# Patient Record
Sex: Female | Born: 1980 | Race: White | Hispanic: No | Marital: Married | Smoking: Never smoker
Health system: Southern US, Community
[De-identification: ages and names within clinical notes are randomized; demographics above are authoritative.]

## PROBLEM LIST (undated history)

## (undated) DIAGNOSIS — T7840XA Allergy, unspecified, initial encounter: Secondary | ICD-10-CM

## (undated) DIAGNOSIS — J309 Allergic rhinitis, unspecified: Secondary | ICD-10-CM

## (undated) DIAGNOSIS — N390 Urinary tract infection, site not specified: Secondary | ICD-10-CM

## (undated) DIAGNOSIS — Z803 Family history of malignant neoplasm of breast: Secondary | ICD-10-CM

## (undated) DIAGNOSIS — Z9189 Other specified personal risk factors, not elsewhere classified: Secondary | ICD-10-CM

## (undated) DIAGNOSIS — Z9289 Personal history of other medical treatment: Secondary | ICD-10-CM

## (undated) DIAGNOSIS — F419 Anxiety disorder, unspecified: Secondary | ICD-10-CM

## (undated) DIAGNOSIS — A749 Chlamydial infection, unspecified: Principal | ICD-10-CM

## (undated) DIAGNOSIS — K219 Gastro-esophageal reflux disease without esophagitis: Secondary | ICD-10-CM

## (undated) HISTORY — DX: Anxiety disorder, unspecified: F41.9

## (undated) HISTORY — DX: Chlamydial infection, unspecified: A74.9

## (undated) HISTORY — DX: Allergic rhinitis, unspecified: J30.9

## (undated) HISTORY — DX: Allergy, unspecified, initial encounter: T78.40XA

## (undated) HISTORY — DX: Urinary tract infection, site not specified: N39.0

## (undated) HISTORY — DX: Gastro-esophageal reflux disease without esophagitis: K21.9

## (undated) HISTORY — DX: Personal history of other medical treatment: Z92.89

## (undated) HISTORY — DX: Family history of malignant neoplasm of breast: Z80.3

## (undated) HISTORY — PX: WISDOM TOOTH EXTRACTION: SHX21

## (undated) HISTORY — DX: Other specified personal risk factors, not elsewhere classified: Z91.89

---

## 2011-07-02 ENCOUNTER — Other Ambulatory Visit: Payer: Self-pay | Admitting: Allergy

## 2011-07-02 ENCOUNTER — Ambulatory Visit
Admission: RE | Admit: 2011-07-02 | Discharge: 2011-07-02 | Disposition: A | Discharge status: Home / Self Care | Payer: Managed Care, Other (non HMO) | Source: Ambulatory Visit | Attending: Allergy | Admitting: Allergy

## 2011-07-02 DIAGNOSIS — J329 Chronic sinusitis, unspecified: Secondary | ICD-10-CM

## 2013-04-07 DIAGNOSIS — Z9189 Other specified personal risk factors, not elsewhere classified: Secondary | ICD-10-CM

## 2013-04-07 HISTORY — DX: Other specified personal risk factors, not elsewhere classified: Z91.89

## 2013-04-27 DIAGNOSIS — Z9289 Personal history of other medical treatment: Secondary | ICD-10-CM

## 2013-04-27 HISTORY — DX: Personal history of other medical treatment: Z92.89

## 2013-06-08 DIAGNOSIS — Z803 Family history of malignant neoplasm of breast: Secondary | ICD-10-CM | POA: Insufficient documentation

## 2013-06-08 HISTORY — DX: Family history of malignant neoplasm of breast: Z80.3

## 2014-05-09 DIAGNOSIS — Z9289 Personal history of other medical treatment: Secondary | ICD-10-CM

## 2014-05-09 HISTORY — DX: Personal history of other medical treatment: Z92.89

## 2015-06-26 LAB — HM PAP SMEAR

## 2015-06-26 LAB — HM MAMMOGRAPHY

## 2016-06-05 DIAGNOSIS — A749 Chlamydial infection, unspecified: Secondary | ICD-10-CM

## 2016-06-05 HISTORY — DX: Chlamydial infection, unspecified: A74.9

## 2016-06-19 ENCOUNTER — Other Ambulatory Visit: Payer: Self-pay | Admitting: Obstetrics and Gynecology

## 2016-06-19 DIAGNOSIS — Z1231 Encounter for screening mammogram for malignant neoplasm of breast: Secondary | ICD-10-CM

## 2016-06-26 ENCOUNTER — Ambulatory Visit (INDEPENDENT_AMBULATORY_CARE_PROVIDER_SITE_OTHER): Payer: BLUE CROSS/BLUE SHIELD | Admitting: Obstetrics and Gynecology

## 2016-06-26 ENCOUNTER — Encounter: Payer: Self-pay | Admitting: Obstetrics and Gynecology

## 2016-06-26 VITALS — BP 138/74 | Ht 67.5 in | Wt 186.0 lb

## 2016-06-26 DIAGNOSIS — Z1231 Encounter for screening mammogram for malignant neoplasm of breast: Secondary | ICD-10-CM

## 2016-06-26 DIAGNOSIS — Z1329 Encounter for screening for other suspected endocrine disorder: Secondary | ICD-10-CM

## 2016-06-26 DIAGNOSIS — Z3041 Encounter for surveillance of contraceptive pills: Secondary | ICD-10-CM

## 2016-06-26 DIAGNOSIS — Z01419 Encounter for gynecological examination (general) (routine) without abnormal findings: Secondary | ICD-10-CM | POA: Diagnosis not present

## 2016-06-26 DIAGNOSIS — Z113 Encounter for screening for infections with a predominantly sexual mode of transmission: Secondary | ICD-10-CM

## 2016-06-26 DIAGNOSIS — Z131 Encounter for screening for diabetes mellitus: Secondary | ICD-10-CM | POA: Diagnosis not present

## 2016-06-26 DIAGNOSIS — J309 Allergic rhinitis, unspecified: Secondary | ICD-10-CM | POA: Insufficient documentation

## 2016-06-26 DIAGNOSIS — R635 Abnormal weight gain: Secondary | ICD-10-CM | POA: Diagnosis not present

## 2016-06-26 DIAGNOSIS — Z9189 Other specified personal risk factors, not elsewhere classified: Secondary | ICD-10-CM | POA: Diagnosis not present

## 2016-06-26 DIAGNOSIS — R102 Pelvic and perineal pain: Secondary | ICD-10-CM | POA: Diagnosis not present

## 2016-06-26 DIAGNOSIS — Z0000 Encounter for general adult medical examination without abnormal findings: Secondary | ICD-10-CM

## 2016-06-26 DIAGNOSIS — Z1239 Encounter for other screening for malignant neoplasm of breast: Secondary | ICD-10-CM

## 2016-06-26 DIAGNOSIS — Z803 Family history of malignant neoplasm of breast: Secondary | ICD-10-CM | POA: Diagnosis not present

## 2016-06-26 MED ORDER — VIORELE 0.15-0.02/0.01 MG (21/5) PO TABS: 1.0000 | ORAL_TABLET | Freq: Every day | ORAL | 3 refills | Status: DC

## 2016-06-26 NOTE — Addendum Note (Signed)
Addended by: Althea GrimmerOPLAND, Nakeyia Menden B on: 06/26/2016 01:44 PM   Modules accepted: Level of Service

## 2016-06-26 NOTE — Progress Notes (Signed)
HPI:      [name] is a 36 y.o. G1P0 who LMP was Patient's last menstrual period was 05/30/2016 (exact date)., presents today for her annual examination.  Her menses are regular every 28-30 days, lasting 5 days.  Dysmenorrhea none. She does have intermenstrual bleeding without late/missed pills. She has had increased acne and melasma since I last saw her. She has also gained over 20# and is concerned. She is eating healthier and has increased exercise without wt loss. She just doesn't feel good "on the inside". She would like to change BC in case cause of sx.  She has mild BLQ pains last yr but sx have worsened since last yr. She notes piercing RLQ pains intermittently that last 1-2 hrs. Sx are very intense and then resolve. She denies any aggrav/allev factors. Sx are 1-2 times monthly and may last over a couple days. She sometimes has less severe LLQ pain as well. She has loose stools with her menses, but is unsure how sx correlate to her cycles. She denies any urin sx, fevers, LBP, n/v/d, vag sx. No dyspareunia.   Sex activity: single partner, contraception - OCP (estrogen/progesterone). No new partners. Last Pap: June 26, 2015  Results were: no abnormalities /neg HPV DNA  Hx of STDs: none  Last mammogram: June 26, 2015  Results were: normal--routine follow-up in 12 months There is a FH of breast cancer in her mom. Genetic testing not done and pt adamantly declines. IBIS=31%. There is no FH of ovarian cancer. The patient does not do self-breast exams.She does not take Vit D supp.  Tobacco use: The patient denies current or previous tobacco use. Alcohol use: none Exercise: very active  She does get adequate calcium but not Vitamin D in her diet.    Past Medical History:  Diagnosis Date  . Allergic rhinitis   . Family history of breast cancer 06/08/2013   Lifetime risk 31.6%  . Increased risk of breast cancer 2015   IBIS=31%    History reviewed. No pertinent surgical  history.  Family History  Problem Relation Age of Onset  . Breast cancer Mother 49    ER-; no genetic testing done  . Basal cell carcinoma Father 88  . Diabetes Maternal Grandmother 48  . Prostate cancer Maternal Grandfather   . Diabetes Paternal Grandmother 50     ROS:  Review of Systems  Constitutional: Negative for fever, malaise/fatigue and weight loss.  HENT: Negative for congestion, ear pain and sinus pain.   Respiratory: Negative for cough, shortness of breath and wheezing.   Cardiovascular: Negative for chest pain, orthopnea and leg swelling.  Gastrointestinal: Negative for constipation, diarrhea, nausea and vomiting.  Genitourinary: Negative for dysuria, frequency, hematuria and urgency.       Breast ROS: negative   Musculoskeletal: Negative for back pain, joint pain and myalgias.  Skin: Negative for itching and rash.  Neurological: Negative for dizziness, tingling, focal weakness and headaches.  Endo/Heme/Allergies: Negative for environmental allergies. Does not bruise/bleed easily.  Psychiatric/Behavioral: Negative for depression and suicidal ideas. The patient is not nervous/anxious and does not have insomnia.     Objective: BP 138/74   Ht 5' 7.5" (1.715 m)   Wt 186 lb (84.4 kg)   LMP 05/30/2016 (Exact Date)   BMI 28.70 kg/m    Physical Exam  Constitutional: She is oriented to person, place, and time. She appears well-developed and well-nourished.  Genitourinary: Vagina normal and uterus normal. No erythema or tenderness in  the vagina. No vaginal discharge found. Right adnexum does not display mass and does not display tenderness. Left adnexum does not display mass and does not display tenderness. Cervix does not exhibit motion tenderness or polyp. Uterus is not enlarged or tender.  Neck: Normal range of motion. No thyromegaly present.  Cardiovascular: Normal rate, regular rhythm and normal heart sounds.   No murmur heard. Pulmonary/Chest: Effort normal and  breath sounds normal. Right breast exhibits no mass, no nipple discharge, no skin change and no tenderness. Left breast exhibits no mass, no nipple discharge, no skin change and no tenderness.  Abdominal: Soft. There is no tenderness. There is no guarding.  Musculoskeletal: Normal range of motion.  Neurological: She is alert and oriented to person, place, and time. No cranial nerve deficit.  Psychiatric: She has a normal mood and affect. Her behavior is normal.  Vitals reviewed.   Assessment/Plan: Encounter for annual routine gynecological examination  Family history of breast cancer - Pt declines cancer genetic testing.  - Plan: MM SCREENING BREAST TOMO BILATERAL  Screening for breast cancer - Plan: MM SCREENING BREAST TOMO BILATERAL  Encounter for surveillance of contraceptive pills - OCP RF eRxd for now. Will eval pelvic pain and then decide on BC. Pt interested in IUD. - Plan: VIORELE 0.15-0.02/0.01 MG (21/5) tablet  Increased risk of breast cancer - IBIS=31% due to FH. Pt declines genetic testing. Continue 3D mammos yearly, monthly SBE, yearly CBE, add Vit D3 5000 IU daily.  Blood tests for routine general physical examination - Plan: Comprehensive metabolic panel, Hemoglobin A1c, TSH + free T4  Thyroid disorder screening - Plan: TSH + free T4  Screening for diabetes mellitus - Plan: Hemoglobin A1c  Weight gain finding - Plan: Hemoglobin A1c, TSH + free T4  Screening for STD (sexually transmitted disease) - Plan: Chlamydia/Gonococcus/Trichomonas, NAA  Pelvic pain - Neg exam. Chck nuswab. Check u/s with sx--pt to call back to sched. Will f/u with results.  - Plan: Chlamydia/Gonococcus/Trichomonas, NAA, US Transvaginal Non-OB              GYN counsel breast self exam, mammography screening, adequate intake of calcium and vitamin D     F/U  Return in about 1 year (around 06/26/2017).  Alexanderjames Berg B. Janashia Parco, PA-C 06/26/2016 12:07 PM

## 2016-06-27 LAB — COMPREHENSIVE METABOLIC PANEL
A/G RATIO: 1.5 (ref 1.2–2.2)
ALBUMIN: 4 g/dL (ref 3.5–5.5)
ALK PHOS: 72 IU/L (ref 39–117)
ALT: 7 IU/L (ref 0–32)
AST: 9 IU/L (ref 0–40)
BUN/Creatinine Ratio: 9 (ref 9–23)
BUN: 7 mg/dL (ref 6–20)
CHLORIDE: 102 mmol/L (ref 96–106)
CO2: 24 mmol/L (ref 18–29)
Calcium: 9.3 mg/dL (ref 8.7–10.2)
Creatinine, Ser: 0.77 mg/dL (ref 0.57–1.00)
GFR calc Af Amer: 117 mL/min/{1.73_m2} (ref >59)
GFR calc non Af Amer: 102 mL/min/{1.73_m2} (ref >59)
GLOBULIN, TOTAL: 2.6 g/dL (ref 1.5–4.5)
Glucose: 106 mg/dL — ABNORMAL HIGH (ref 65–99)
Potassium: 4.5 mmol/L (ref 3.5–5.2)
SODIUM: 142 mmol/L (ref 134–144)
Total Protein: 6.6 g/dL (ref 6.0–8.5)

## 2016-06-27 LAB — HEMOGLOBIN A1C
Est. average glucose Bld gHb Est-mCnc: 94 mg/dL
Hgb A1c MFr Bld: 4.9 % (ref 4.8–5.6)

## 2016-06-27 LAB — TSH+FREE T4
Free T4: 1.09 ng/dL (ref 0.82–1.77)
TSH: 3.42 u[IU]/mL (ref 0.450–4.500)

## 2016-06-28 LAB — CHLAMYDIA/GONOCOCCUS/TRICHOMONAS, NAA
Chlamydia by NAA: POSITIVE — AB
Gonococcus by NAA: NEGATIVE
Trich vag by NAA: NEGATIVE

## 2016-06-30 ENCOUNTER — Other Ambulatory Visit: Payer: Self-pay | Admitting: Obstetrics and Gynecology

## 2016-06-30 ENCOUNTER — Telehealth: Payer: Self-pay | Admitting: Obstetrics and Gynecology

## 2016-06-30 DIAGNOSIS — A749 Chlamydial infection, unspecified: Secondary | ICD-10-CM | POA: Insufficient documentation

## 2016-06-30 MED ORDER — DOXYCYCLINE HYCLATE 100 MG PO CAPS: 100.0000 mg | ORAL_CAPSULE | Freq: Two times a day (BID) | ORAL | 0 refills | Status: DC

## 2016-06-30 NOTE — Telephone Encounter (Signed)
Pt aware of positive chlamydia. Rx doxy eRxd. Partner needs tx. RTO in 3 wks for TOC and GYN u/s to assess RLQ pain (concerned may be related to chlamydia). Pt to RTO sooner for u/s if develops pain sx. RN to send letter to [facility].

## 2016-06-30 NOTE — Telephone Encounter (Signed)
Letter sent.

## 2016-07-16 ENCOUNTER — Ambulatory Visit (INDEPENDENT_AMBULATORY_CARE_PROVIDER_SITE_OTHER): Payer: BLUE CROSS/BLUE SHIELD | Admitting: Obstetrics and Gynecology

## 2016-07-16 ENCOUNTER — Encounter: Payer: Self-pay | Admitting: Obstetrics and Gynecology

## 2016-07-16 ENCOUNTER — Ambulatory Visit: Payer: BLUE CROSS/BLUE SHIELD

## 2016-07-16 VITALS — BP 138/90 | HR 96 | Ht 67.5 in | Wt 184.0 lb

## 2016-07-16 DIAGNOSIS — Z30014 Encounter for initial prescription of intrauterine contraceptive device: Secondary | ICD-10-CM

## 2016-07-16 DIAGNOSIS — A749 Chlamydial infection, unspecified: Secondary | ICD-10-CM | POA: Diagnosis not present

## 2016-07-16 DIAGNOSIS — R102 Pelvic and perineal pain: Secondary | ICD-10-CM

## 2016-07-16 NOTE — Progress Notes (Signed)
Chief Complaint  Patient presents with  . Gynecologic Exam    TOC     HPI:      [name] is a 36 y.o. G1P0 who LMP was Patient's last menstrual period was 06/27/2016., presents today for STD test of cure. She was diagnosed with chlamydia June 26, 2016 at annual. She was treated with doxy BID for 2 wks. Her partner has been treated. She denies any further symptoms. Her BLQ pain improved with menses and hasn't had any BTB this pill pack so far since chlamydia tx. She is still interested in changing BC due to melasma. She is interested in the IUD.    06/26/16 NOTE:  Her menses are regular every 28-30 days, lasting 5 days.  Dysmenorrhea none. She does have intermenstrual bleeding without late/missed pills. She has had increased acne and melasma since I last saw her. She has also gained over 20# and is concerned. She is eating healthier and has increased exercise without wt loss. She just doesn't feel good "on the inside". She would like to change BC in case cause of sx.  She has mild BLQ pains last yr but sx have worsened since last yr. She notes piercing RLQ pains intermittently that last 1-2 hrs. Sx are very intense and then resolve. She denies any aggrav/allev factors. Sx are 1-2 times monthly and may last over a couple days. She sometimes has less severe LLQ pain as well. She has loose stools with her menses, but is unsure how sx correlate to her cycles. She denies any urin sx, fevers, LBP, n/v/d, vag sx. No dyspareunia.   Past Medical History:  Diagnosis Date  . Allergic rhinitis   . Chlamydia 06/2016  . Family history of breast cancer 06/08/2013   Lifetime risk 31.6%  . History of mammogram 05/09/2014   birad 2;  . History of Papanicolaou smear of cervix 04/27/2013   -/-;  . Increased risk of breast cancer 2015   IBIS=31%    Social History   Social History  . Marital status: Single    Spouse name: N/A  . Number of children: N/A  . Years of education: N/A    Occupational History  . Not on file.   Social History Main Topics  . Smoking status: Never Smoker  . Smokeless tobacco: Never Used  . Alcohol use Yes     Comment: rarely  . Drug use: No  . Sexual activity: Yes    Birth control/ protection: Pill   Other Topics Concern  . Not on file   Social History Narrative  . No narrative on file     Current Outpatient Prescriptions:  .  azelastine (ASTELIN) 0.1 % nasal spray, Place into both nostrils 2 (two) times daily. Use in each nostril as directed, Disp: , Rfl:  .  fexofenadine (ALLEGRA) 30 MG tablet, Take 30 mg by mouth 2 (two) times daily., Disp: , Rfl:  .  fluticasone (FLONASE) 50 MCG/ACT nasal spray, PLACE 1-2 SPRAYS IN EACH NOSTRIL ONCE A DAY, Disp: , Rfl: 6 .  VIORELE 0.15-0.02/0.01 MG (21/5) tablet, Take 1 tablet by mouth daily., Disp: 1 Package, Rfl: 3 .  doxycycline (VIBRAMYCIN) 100 MG capsule, Take 1 capsule (100 mg total) by mouth 2 (two) times daily. (Patient not taking: Reported on 07/16/2016), Disp: 14 capsule, Rfl: 0   ROS:  Review of Systems  Constitutional: Negative for fever, malaise/fatigue and weight loss.  Gastrointestinal: Negative for blood in stool, constipation, diarrhea, nausea and vomiting.  Genitourinary: Negative for dysuria, flank pain, frequency, hematuria and urgency.  Musculoskeletal: Negative for back pain.  Skin: Negative for itching and rash.    Objective: BP 138/90 (Patient Position: Sitting)   Pulse 96   Ht 5' 7.5" (1.715 m)   Wt 184 lb (83.5 kg)   LMP 06/27/2016   BMI 28.39 kg/m    Physical Exam  Genitourinary: There is no rash, tenderness, lesion or Bartholin's cyst on the right labia. There is no rash, tenderness, lesion or Bartholin's cyst on the left labia. No erythema, tenderness or bleeding in the vagina. No vaginal discharge found. Right adnexum does not display mass and does not display tenderness. Left adnexum does not display mass and does not display tenderness. Cervix does  not exhibit motion tenderness, discharge or friability. Uterus is not enlarged or tender.  Nursing note and vitals reviewed.    Assessment/Plan:  Chlamydia - TOC today. Will call pt with results. - Plan: Chlamydia/Gonococcus/Trichomonas, NAA  Encounter for initial prescription of intrauterine contraceptive device (IUD) - Pt would like to change to IUD due to melasma, not feeling well with OCPs. [facility] handout given to pt. RTO with menses for insertion. NSAIDs/will do cytotec  Pelvic pain - Sx improved after chlamydia tx. Will check u/s if sx recur. U/S cancelled for today since sx resolved. If not, follow expectantly.    F/U  Return if symptoms worsen or fail to improve.  Reshard Guillet B. Skylor Schnapp, PA-C 07/16/2016 12:07 PM

## 2016-07-17 ENCOUNTER — Encounter: Payer: Self-pay | Admitting: Obstetrics and Gynecology

## 2016-07-17 ENCOUNTER — Ambulatory Visit
Admission: RE | Admit: 2016-07-17 | Discharge: 2016-07-17 | Disposition: A | Discharge status: Home / Self Care | Payer: BLUE CROSS/BLUE SHIELD | Source: Ambulatory Visit | Attending: Obstetrics and Gynecology | Admitting: Obstetrics and Gynecology

## 2016-07-17 DIAGNOSIS — Z1231 Encounter for screening mammogram for malignant neoplasm of breast: Secondary | ICD-10-CM | POA: Insufficient documentation

## 2016-07-18 LAB — CHLAMYDIA/GONOCOCCUS/TRICHOMONAS, NAA
Chlamydia by NAA: NEGATIVE
GONOCOCCUS BY NAA: NEGATIVE
TRICH VAG BY NAA: NEGATIVE

## 2016-07-21 ENCOUNTER — Telehealth: Payer: Self-pay | Admitting: Obstetrics and Gynecology

## 2016-07-21 ENCOUNTER — Other Ambulatory Visit: Payer: Self-pay | Admitting: Obstetrics and Gynecology

## 2016-07-21 MED ORDER — MISOPROSTOL 100 MCG PO TABS: 100.0000 ug | ORAL_TABLET | Freq: Once | ORAL | 0 refills | Status: DC

## 2016-07-21 NOTE — Telephone Encounter (Signed)
Pt aware of neg gon/chlam results.  She found out IUD is covered with her insurance. Menses will be this wk on OCPs. Pt to RTO 07/24/16 for IUD Rutha Bouchard) insertion. NSAID/cytotec 1 hr before appt.

## 2016-07-23 NOTE — Telephone Encounter (Signed)
Debbie Farmer stock reserved for this patient. 

## 2016-07-23 NOTE — Telephone Encounter (Signed)
This encounter was created in error - please disregard.

## 2016-07-24 ENCOUNTER — Ambulatory Visit (INDEPENDENT_AMBULATORY_CARE_PROVIDER_SITE_OTHER): Payer: BLUE CROSS/BLUE SHIELD | Admitting: Obstetrics and Gynecology

## 2016-07-24 ENCOUNTER — Encounter: Payer: Self-pay | Admitting: Obstetrics and Gynecology

## 2016-07-24 ENCOUNTER — Ambulatory Visit (INDEPENDENT_AMBULATORY_CARE_PROVIDER_SITE_OTHER): Payer: BLUE CROSS/BLUE SHIELD

## 2016-07-24 VITALS — BP 142/90 | HR 112 | Wt 184.0 lb

## 2016-07-24 DIAGNOSIS — G8929 Other chronic pain: Secondary | ICD-10-CM

## 2016-07-24 DIAGNOSIS — R1031 Right lower quadrant pain: Secondary | ICD-10-CM

## 2016-07-24 DIAGNOSIS — Z30430 Encounter for insertion of intrauterine contraceptive device: Secondary | ICD-10-CM | POA: Diagnosis not present

## 2016-07-24 NOTE — Progress Notes (Addendum)
   Chief Complaint  Patient presents with  . Contraception     IUD PROCEDURE NOTE:  Debbie Farmer is a 36 y.o. G0P0 here for [facility] IUD insertion. No GYN concerns.  Last pap smear was normal.  BP (!) 142/90 (BP Location: Left Arm, Patient Position: Sitting, Cuff Size: Normal)   Pulse (!) 112   Wt 184 lb (83.5 kg)   LMP 07/22/2016   BMI 28.39 kg/m   IUD Insertion Procedure Note Patient identified, informed consent performed, consent signed.   Discussed risks of irregular bleeding, cramping, infection, malpositioning or misplacement of the IUD outside the uterus which may require further procedure such as laparoscopy, risk of failure <1%. Time out was performed.   A bimanual exam showed the uterus to be retroverted.  Speculum placed in the vagina.  Cervix visualized.  Cleaned with Betadine x 2.  Grasped anteriorly with a single tooth tenaculum.  Uterus sounded to 6.5 cm.   IUD placed per manufacturer's recommendations.  Strings trimmed to 3 cm. Tenaculum was removed, good hemostasis noted.  Patient tolerated procedure well.   Results:  GYN U/S-->EM=3.56MM; NO FF IN CDS; IUD IN PLACE; BILAT OVAR WITH FOLLICLES  ASSESSMENT: Encounter for insertion of intrauterine contraceptive device (IUD) - Kyleena inserted. RTO in 4 wks for check.  Chronic RLQ pain - Pt's pelvic pain improved after chlamydia tx a couple wks agbut still having RLQ pain with menses. GYN u/s neg. Reassurance. F/u prn sx. (F/U from 07/16/16 appt) - Plan: US Transvaginal Non-OB    Plan:  Patient was given post-procedure instructions.  She was advised to have backup contraception for one week.   Call if you are having increasing pain, cramps or bleeding or if you have a fever greater than 100.4 degrees F., shaking chills, nausea or vomiting. Patient was also asked to check IUD strings periodically and follow up in 4 weeks for IUD check.  Return in about 4 weeks (around 08/21/2016) for IUD string check.  Debbie Schoof B. Dericka Ostenson,  PA-C 07/24/2016 7:38 PM

## 2016-07-24 NOTE — Patient Instructions (Signed)

## 2016-07-25 ENCOUNTER — Inpatient Hospital Stay
Admission: RE | Admit: 2016-07-25 | Discharge: 2016-07-25 | Disposition: A | Discharge status: Home / Self Care | Payer: Self-pay | Source: Ambulatory Visit | Attending: *Deleted | Admitting: *Deleted

## 2016-07-25 ENCOUNTER — Other Ambulatory Visit: Payer: Self-pay | Admitting: *Deleted

## 2016-07-25 DIAGNOSIS — Z9289 Personal history of other medical treatment: Secondary | ICD-10-CM

## 2016-07-26 ENCOUNTER — Encounter: Payer: Self-pay | Admitting: Obstetrics and Gynecology

## 2016-08-21 ENCOUNTER — Ambulatory Visit (INDEPENDENT_AMBULATORY_CARE_PROVIDER_SITE_OTHER): Payer: BLUE CROSS/BLUE SHIELD | Admitting: Obstetrics and Gynecology

## 2016-08-21 ENCOUNTER — Encounter: Payer: Self-pay | Admitting: Obstetrics and Gynecology

## 2016-08-21 VITALS — BP 120/80 | HR 125 | Ht 67.0 in | Wt 181.0 lb

## 2016-08-21 DIAGNOSIS — Z30431 Encounter for routine checking of intrauterine contraceptive device: Secondary | ICD-10-CM

## 2016-08-21 DIAGNOSIS — L811 Chloasma: Secondary | ICD-10-CM

## 2016-08-21 MED ORDER — LEVONORGESTREL 19.5 MG IU IUD: 19.5000 mg | INTRAUTERINE_SYSTEM | Freq: Once | INTRAUTERINE | 0 refills | Status: DC

## 2016-08-21 MED ORDER — HYDROQUINONE 4 % EX CREA: TOPICAL_CREAM | Freq: Two times a day (BID) | CUTANEOUS | 0 refills | Status: DC

## 2016-08-21 NOTE — Progress Notes (Signed)
   Chief Complaint  Patient presents with  . iud check     History of Present Illness:  Debbie Farmer is a 36 y.o. that had a [place] IUD placed approximately 1 month ago. Since that time, she states has done really well. She has had regular light spotting since insertion but no heavy bleeding. She denies dyspareunia/cramping/pelvic pain. She is happy with it so far. Pelvic pain sx from last month have resolved (had neg u/s 07/24/16)  Review of Systems  Constitutional: Negative for fatigue, fever and unexpected weight change.  Respiratory: Negative for cough, shortness of breath and wheezing.   Cardiovascular: Negative for chest pain, palpitations and leg swelling.  Gastrointestinal: Negative for blood in stool, constipation, diarrhea, nausea and vomiting.  Endocrine: Negative for cold intolerance, heat intolerance and polyuria.  Genitourinary: Positive for vaginal bleeding. Negative for dyspareunia, dysuria, flank pain, frequency, genital sores, hematuria, menstrual problem, pelvic pain, urgency, vaginal discharge and vaginal pain.  Musculoskeletal: Negative for back pain, joint swelling and myalgias.  Skin: Negative for rash.  Neurological: Negative for dizziness, syncope, light-headedness, numbness and headaches.  Hematological: Negative for adenopathy.  Psychiatric/Behavioral: Negative for agitation, confusion, sleep disturbance and suicidal ideas. The patient is not nervous/anxious.     Physical Exam:  BP 120/80   Pulse (!) 125   Ht 5\' 7"  (1.702 m)   Wt 181 lb (82.1 kg)   LMP 07/22/2016   BMI 28.35 kg/m  Body mass index is 28.35 kg/m.  Pelvic exam:  Two IUD strings present seen coming from the cervical os. EGBUS, vaginal vault and cervix: within normal limits   Assessment:  Encounter for routine checking of intrauterine contraceptive device (IUD) - IUD in place. Pt doing well. F/u prn. - Plan: Levonorgestrel (KYLEENA) 19.5 MG IUD  Melasma - Pt would like cream to help it go  away. Hoping switching from OCPs will resolve sx. Rx hydroquinone. F/u prn. - Plan: hydroquinone 4 % cream    IUD strings present in proper location; pt doing well  Plan: F/u if any signs of infection or can no longer feel the strings.   Yuniel Blaney B. Altha Sweitzer, PA-C 08/21/2016 9:42 AM

## 2017-04-07 DIAGNOSIS — K219 Gastro-esophageal reflux disease without esophagitis: Secondary | ICD-10-CM

## 2017-04-07 HISTORY — DX: Gastro-esophageal reflux disease without esophagitis: K21.9

## 2017-04-16 ENCOUNTER — Encounter: Payer: Self-pay | Admitting: Primary Care

## 2017-04-16 ENCOUNTER — Ambulatory Visit (INDEPENDENT_AMBULATORY_CARE_PROVIDER_SITE_OTHER): Payer: BLUE CROSS/BLUE SHIELD | Admitting: Primary Care

## 2017-04-16 VITALS — BP 134/82 | HR 115 | Temp 97.4°F | Ht 67.5 in | Wt 186.2 lb

## 2017-04-16 DIAGNOSIS — F419 Anxiety disorder, unspecified: Secondary | ICD-10-CM | POA: Diagnosis not present

## 2017-04-16 DIAGNOSIS — R1011 Right upper quadrant pain: Secondary | ICD-10-CM

## 2017-04-16 DIAGNOSIS — R635 Abnormal weight gain: Secondary | ICD-10-CM | POA: Diagnosis not present

## 2017-04-16 DIAGNOSIS — J309 Allergic rhinitis, unspecified: Secondary | ICD-10-CM | POA: Diagnosis not present

## 2017-04-16 HISTORY — DX: Right upper quadrant pain: R10.11

## 2017-04-16 LAB — COMPREHENSIVE METABOLIC PANEL
ALK PHOS: 65 U/L (ref 39–117)
ALT: 13 U/L (ref 0–35)
AST: 12 U/L (ref 0–37)
Albumin: 4.4 g/dL (ref 3.5–5.2)
BILIRUBIN TOTAL: 0.4 mg/dL (ref 0.2–1.2)
BUN: 11 mg/dL (ref 6–23)
CO2: 28 mEq/L (ref 19–32)
Calcium: 9.7 mg/dL (ref 8.4–10.5)
Chloride: 106 mEq/L (ref 96–112)
Creatinine, Ser: 0.78 mg/dL (ref 0.40–1.20)
GFR: 89.47 mL/min (ref >60.00)
GLUCOSE: 114 mg/dL — AB (ref 70–99)
Potassium: 5.2 mEq/L — ABNORMAL HIGH (ref 3.5–5.1)
SODIUM: 141 meq/L (ref 135–145)
TOTAL PROTEIN: 7.6 g/dL (ref 6.0–8.3)

## 2017-04-16 LAB — TSH: TSH: 4.85 u[IU]/mL — AB (ref 0.35–4.50)

## 2017-04-16 NOTE — Patient Instructions (Signed)
Stop by the lab prior to leaving today.   Stop by the front desk and speak with either Shirlee LimerickMarion or Zella BallRobin regarding your referral to therapy and also for your ultrasound.  I'll be in touch once I receive your lab results and ultrasound.  It was a pleasure to meet you today! Please don't hesitate to call or message me with any questions. Welcome to [facility]!

## 2017-04-16 NOTE — Assessment & Plan Note (Signed)
Tearful and upset during most of the exam. Also getting up and down from her chair several times. GAD 7 score of 14 today. Urgent referral placed to therapy.

## 2017-04-16 NOTE — Progress Notes (Signed)
Subjective:    Patient ID: Debbie Farmer, female    DOB: 1981-02-08, 37 y.o.   MRN: [id number]  HPI  Debbie Farmer is a 37 year old female who presents today to establish care and discuss the problems mentioned below. Will obtain old records.  1) Abdominal Pain: Located to the right upper quadrant that has been present for the past 2-3 months. She's also noted esophageal reflux that will occur several times weekly. She will take chewable tums several times weekly with relief. She endorses daily bowel movements which are soft stools. Her pain is mostly noticeable in the evening while watching TV or playing on her computer and will occur several times weekly. She denies nausea and vomiting, rectal bleeding.   2) Allergic Rhinitis: Currently following with an allergist and is on immunotherapy. She is also managed on Xyzal, Singulair, several nasal sprays.  3) Anxiety: Difficulty coping with stress, difficulty sleeping, financial difficulties. She's been experiencing these symptoms for the past several months. She is interested in seeing a therapist. She is upset today as she doesn't feel like she can handle everything that's been going on in her personal life at once. GAD 7 score of 14 today.  Review of Systems  Constitutional: Negative for fever and unexpected weight change.  Gastrointestinal: Positive for abdominal pain. Negative for blood in stool, constipation, diarrhea and vomiting.  Psychiatric/Behavioral: The patient is nervous/anxious.        Past Medical History:  Diagnosis Date  . Allergic rhinitis   . Allergy   . Chlamydia 06/2016  . Family history of breast cancer 06/08/2013   Lifetime risk 31.6%  . History of mammogram 05/09/2014   birad 2;  . History of Papanicolaou smear of cervix 04/27/2013   -/-;  . Increased risk of breast cancer 2015   IBIS=31%  . Urinary tract infection      Social History   Socioeconomic History  . Marital status: Married    Spouse name:  Not on file  . Number of children: Not on file  . Years of education: Not on file  . Highest education level: Not on file  Social Needs  . Financial resource strain: Not on file  . Food insecurity - worry: Not on file  . Food insecurity - inability: Not on file  . Transportation needs - medical: Not on file  . Transportation needs - non-medical: Not on file  Occupational History  . Not on file  Tobacco Use  . Smoking status: Never Smoker  . Smokeless tobacco: Never Used  Substance and Sexual Activity  . Alcohol use: Yes    Comment: rarely  . Drug use: No  . Sexual activity: Yes    Birth control/protection: IUD  Other Topics Concern  . Not on file  Social History Narrative  . Not on file    Past Surgical History:  Procedure Laterality Date  . WISDOM TOOTH EXTRACTION    . WISDOM TOOTH EXTRACTION      Family History  Problem Relation Age of Onset  . Breast cancer Mother [id number]       ER-; no genetic testing done  . Arthritis Mother   . Cancer Mother   . Miscarriages / [place] Mother   . Basal cell carcinoma Father [id number]  . Hypertension Father   . Diabetes Maternal Grandmother 48  . Heart disease Maternal Grandmother   . Prostate cancer Maternal Grandfather   . Heart disease Maternal Grandfather   . Hypertension Maternal Grandfather   .  Diabetes Paternal Grandmother 84  . Heart disease Paternal Grandfather   . Hypertension Paternal Grandfather     Allergies  Allergen Reactions  . Corn-Containing Products   . Ginger   . Arlice Colt Isothiocyanate]   . Sesame Seed (Diagnostic)   . Penicillins Rash    Current Outpatient Medications on File Prior to Visit  Medication Sig Dispense Refill  . fexofenadine (ALLEGRA) 30 MG tablet Take 30 mg by mouth 2 (two) times daily.    . fluticasone (FLONASE) 50 MCG/ACT nasal spray PLACE 1-2 SPRAYS IN EACH NOSTRIL ONCE A DAY  6  . hydroquinone 4 % cream Apply topically 2 (two) times daily. 28.35 g 0  . levocetirizine (XYZAL) 5  MG tablet Take 5 mg by mouth every evening.    . montelukast (SINGULAIR) 10 MG tablet Take 10 mg by mouth at bedtime.    . Levonorgestrel (KYLEENA) 19.5 MG IUD 19.5 mg by Intrauterine route once. 1 Intra Uterine Device 0   No current facility-administered medications on file prior to visit.     BP 134/82   Pulse (!) 115   Temp (!) 97.4 F (36.3 C) (Oral)   Ht 5' 7.5" (1.715 m)   Wt 186 lb 4 oz (84.5 kg)   SpO2 99%   BMI 28.74 kg/m    Objective:   Physical Exam  Constitutional: She appears well-nourished.  Neck: Neck supple.  Cardiovascular: Regular rhythm.  Pulmonary/Chest: Effort normal and breath sounds normal.  Abdominal: Soft. Bowel sounds are normal. There is no tenderness.  Skin: Skin is warm and dry.  Psychiatric: She has a normal mood and affect.  Anxious and tearful during most of her exam. Getting up and down out of her chair during conversation.           Assessment & Plan:

## 2017-04-16 NOTE — Assessment & Plan Note (Signed)
Following with an allergist and on immunotherapy.  Continue current regimen.

## 2017-04-16 NOTE — Assessment & Plan Note (Signed)
Present for the past several months. Exam today benign, she doesn't appear sickly. She does have a tremendous amount of anxiety during her exam today from stress in her personal life, this could be causing pain. Check CBC, CMP, TSH, and RUQ abdominal ultrasound.

## 2017-04-17 ENCOUNTER — Ambulatory Visit
Admission: RE | Admit: 2017-04-17 | Discharge: 2017-04-17 | Disposition: A | Discharge status: Home / Self Care | Payer: BLUE CROSS/BLUE SHIELD | Source: Ambulatory Visit | Attending: Primary Care | Admitting: Primary Care

## 2017-04-17 ENCOUNTER — Encounter: Payer: Self-pay | Admitting: Primary Care

## 2017-04-17 ENCOUNTER — Telehealth: Payer: Self-pay | Admitting: Primary Care

## 2017-04-17 DIAGNOSIS — R932 Abnormal findings on diagnostic imaging of liver and biliary tract: Secondary | ICD-10-CM | POA: Insufficient documentation

## 2017-04-17 DIAGNOSIS — K219 Gastro-esophageal reflux disease without esophagitis: Secondary | ICD-10-CM

## 2017-04-17 DIAGNOSIS — R1011 Right upper quadrant pain: Secondary | ICD-10-CM | POA: Insufficient documentation

## 2017-04-17 MED ORDER — RANITIDINE HCL 150 MG PO TABS: 150.0000 mg | ORAL_TABLET | Freq: Two times a day (BID) | ORAL | 0 refills | Status: DC

## 2017-04-17 NOTE — Telephone Encounter (Signed)
Copied from CRM [phone]. Topic: Quick Communication - See Telephone Encounter >> Apr 17, 2017 10:58 AM Diana EvesHoyt, Maryann B wrote: CRM for notification. See Telephone encounter for:  Pt calling to see if the zantac 150 mg needs to be purchased OTC or called in to pharmacy as Chestine Sporelark told her if she agreed with taking it she would call the pharmacy  04/17/17.

## 2017-04-17 NOTE — Telephone Encounter (Signed)
Yes, I can call this into her pharmacy. I'm assuming this is her agreeing to the medication so please notify her this was sent in. Take 1 tablet by mouth twice daily for the next 4 weeks. Please have her update me if no improvement within 2 weeks.

## 2017-04-17 NOTE — Telephone Encounter (Signed)
Per DPR, left detail message of Kate's comments for patient. 

## 2017-04-20 ENCOUNTER — Ambulatory Visit (INDEPENDENT_AMBULATORY_CARE_PROVIDER_SITE_OTHER): Payer: BLUE CROSS/BLUE SHIELD | Admitting: Psychology

## 2017-04-20 DIAGNOSIS — F4323 Adjustment disorder with mixed anxiety and depressed mood: Secondary | ICD-10-CM | POA: Diagnosis not present

## 2017-04-27 ENCOUNTER — Ambulatory Visit (INDEPENDENT_AMBULATORY_CARE_PROVIDER_SITE_OTHER): Payer: BLUE CROSS/BLUE SHIELD | Admitting: Psychology

## 2017-04-27 DIAGNOSIS — F4323 Adjustment disorder with mixed anxiety and depressed mood: Secondary | ICD-10-CM | POA: Diagnosis not present

## 2017-05-01 ENCOUNTER — Ambulatory Visit: Payer: Self-pay | Admitting: *Deleted

## 2017-05-01 NOTE — Telephone Encounter (Signed)
Pt reports neck pain upon awakening this am. Shooting pain with movement when turning head to left. Begins top of shoulder, through neck and behind ear. States good ROM to right, limited to left, able to touch chin to chest, "can not bend head back very far." Pain present only with movement. Denies dizziness, headache, no injury, no rash present.   Reports "Very mild nausea with pain." Did light exercise yesterday am with free weights. Took Advil 30 minutes ago, "hot shower helped." Pt also states she had allergy injection left arm yesterday, arm was "puffy" last night, no swelling or redness this am. Pt states will report this to Allergy MD today. Home care advice given per protocol; instructed to call back if symptoms worsen, unresolved after 3 days.   Reason for Disposition . Neck pain  or stiffness  Answer Assessment - Initial Assessment Questions 1. ONSET: "When did the pain begin?"     Last night, early morning 2. LOCATION: "Where does it hurt?"      Left side of neck, top of shoulder through neck, behind ear. Only on left side "Shooting" 3. PATTERN "Does the pain come and go, or has it been constant since it started?"     Constant.  No pain if still 4. SEVERITY: "How bad is the pain?"  (Scale 1-10; or mild, moderate, severe)   - MILD (1-3): doesn't interfere with normal activities    - MODERATE (4-7): interferes with normal activities or awakens from sleep    - SEVERE (8-10):  excruciating pain, unable to do any normal activities      10/10 5. RADIATION: "Does the pain go anywhere else, shoot into your arms?"     No 6. CORD SYMPTOMS: "Any weakness or numbness of the arms or legs?"     No 7. CAUSE: "What do you think is causing the neck pain?"    Not sure 8. NECK OVERUSE: "Any recent activities that involved turning or twisting the neck?"     Light exercise yesterday am... free weights 9. OTHER SYMPTOMS: "Do you have any other symptoms?" (e.g., headache, fever, chest pain, difficulty  breathing, neck swelling) Just completed allergy injections     Mild nausea, intermittent 10. PREGNANCY: "Is there any chance you are pregnant?" "When was your last menstrual period?"       Probably not.  Protocols used: NECK PAIN OR STIFFNESS-A-AH

## 2017-05-04 ENCOUNTER — Ambulatory Visit (INDEPENDENT_AMBULATORY_CARE_PROVIDER_SITE_OTHER): Payer: BLUE CROSS/BLUE SHIELD | Admitting: Psychology

## 2017-05-04 DIAGNOSIS — F4323 Adjustment disorder with mixed anxiety and depressed mood: Secondary | ICD-10-CM | POA: Diagnosis not present

## 2017-05-06 ENCOUNTER — Other Ambulatory Visit: Payer: Self-pay | Admitting: Primary Care

## 2017-05-06 DIAGNOSIS — R7989 Other specified abnormal findings of blood chemistry: Secondary | ICD-10-CM

## 2017-05-11 ENCOUNTER — Ambulatory Visit (INDEPENDENT_AMBULATORY_CARE_PROVIDER_SITE_OTHER): Payer: BLUE CROSS/BLUE SHIELD | Admitting: Psychology

## 2017-05-11 DIAGNOSIS — F4323 Adjustment disorder with mixed anxiety and depressed mood: Secondary | ICD-10-CM

## 2017-05-13 ENCOUNTER — Other Ambulatory Visit (INDEPENDENT_AMBULATORY_CARE_PROVIDER_SITE_OTHER): Payer: BLUE CROSS/BLUE SHIELD

## 2017-05-13 DIAGNOSIS — R7989 Other specified abnormal findings of blood chemistry: Secondary | ICD-10-CM

## 2017-05-13 LAB — T4, FREE: Free T4: 0.77 ng/dL (ref 0.60–1.60)

## 2017-05-13 LAB — TSH: TSH: 2.31 u[IU]/mL (ref 0.35–4.50)

## 2017-05-18 ENCOUNTER — Ambulatory Visit: Payer: BLUE CROSS/BLUE SHIELD | Admitting: Psychology

## 2017-05-19 ENCOUNTER — Ambulatory Visit (INDEPENDENT_AMBULATORY_CARE_PROVIDER_SITE_OTHER): Payer: BLUE CROSS/BLUE SHIELD | Admitting: Psychology

## 2017-05-19 DIAGNOSIS — F4323 Adjustment disorder with mixed anxiety and depressed mood: Secondary | ICD-10-CM | POA: Diagnosis not present

## 2017-05-25 ENCOUNTER — Other Ambulatory Visit: Payer: Self-pay | Admitting: Primary Care

## 2017-05-25 DIAGNOSIS — K219 Gastro-esophageal reflux disease without esophagitis: Secondary | ICD-10-CM

## 2017-05-25 MED ORDER — RANITIDINE HCL 150 MG PO TABS: 150.0000 mg | ORAL_TABLET | Freq: Two times a day (BID) | ORAL | 5 refills | Status: DC

## 2017-05-26 ENCOUNTER — Ambulatory Visit (INDEPENDENT_AMBULATORY_CARE_PROVIDER_SITE_OTHER): Payer: BLUE CROSS/BLUE SHIELD | Admitting: Psychology

## 2017-05-26 DIAGNOSIS — F422 Mixed obsessional thoughts and acts: Secondary | ICD-10-CM | POA: Diagnosis not present

## 2017-06-01 ENCOUNTER — Ambulatory Visit (INDEPENDENT_AMBULATORY_CARE_PROVIDER_SITE_OTHER): Payer: BLUE CROSS/BLUE SHIELD | Admitting: Psychology

## 2017-06-01 DIAGNOSIS — F4323 Adjustment disorder with mixed anxiety and depressed mood: Secondary | ICD-10-CM | POA: Diagnosis not present

## 2017-06-08 ENCOUNTER — Ambulatory Visit: Payer: BLUE CROSS/BLUE SHIELD | Admitting: Psychology

## 2017-06-08 DIAGNOSIS — F4323 Adjustment disorder with mixed anxiety and depressed mood: Secondary | ICD-10-CM | POA: Diagnosis not present

## 2017-06-17 ENCOUNTER — Ambulatory Visit: Payer: BLUE CROSS/BLUE SHIELD | Admitting: Psychology

## 2017-06-17 DIAGNOSIS — F4323 Adjustment disorder with mixed anxiety and depressed mood: Secondary | ICD-10-CM

## 2017-06-22 ENCOUNTER — Ambulatory Visit: Payer: BLUE CROSS/BLUE SHIELD | Admitting: Psychology

## 2017-06-26 ENCOUNTER — Ambulatory Visit: Payer: BLUE CROSS/BLUE SHIELD | Admitting: Psychology

## 2017-08-03 ENCOUNTER — Encounter: Payer: Self-pay | Admitting: Internal Medicine

## 2017-08-03 ENCOUNTER — Ambulatory Visit: Payer: BLUE CROSS/BLUE SHIELD | Admitting: Internal Medicine

## 2017-08-03 VITALS — BP 128/84 | HR 91 | Temp 98.9°F | Wt 182.0 lb

## 2017-08-03 DIAGNOSIS — J301 Allergic rhinitis due to pollen: Secondary | ICD-10-CM | POA: Diagnosis not present

## 2017-08-03 DIAGNOSIS — J029 Acute pharyngitis, unspecified: Secondary | ICD-10-CM | POA: Diagnosis not present

## 2017-08-03 LAB — POCT RAPID STREP A (OFFICE): Rapid Strep A Screen: NEGATIVE

## 2017-08-03 MED ORDER — METHYLPREDNISOLONE ACETATE 80 MG/ML IJ SUSP
80.0000 mg | Freq: Once | INTRAMUSCULAR | Status: AC
  Administered 2017-08-03: 80 mg via INTRAMUSCULAR

## 2017-08-03 NOTE — Patient Instructions (Signed)

## 2017-08-03 NOTE — Addendum Note (Signed)
Addended by: Roena Malady on: 08/03/2017 10:32 AM   Modules accepted: Orders

## 2017-08-03 NOTE — Progress Notes (Signed)
HPI  Pt presents to the clinic today with c/o nasal congestion, ear pain, sore throat and cough. This started 3-4 days ago. She is blowing clear mucous out of her nose. She describes the ear pain as sharp and stabbing, R>L. The right side of her throat hurts more than the left, she does have difficulty swallowing. The cough is productive of brown mucous. She denies shortness of breath. She denies fever, chills but has had some body aches. She has tried Flonase, Astelin, Singulair, Xyzal, and Alka Seltzer Cold with minimal relief. She has a history of allergies and does take allergy shots. She has not had sick contacts.  Review of Systems     Past Medical History:  Diagnosis Date  . Allergic rhinitis   . Allergy   . Chlamydia 06/2016  . Family history of breast cancer 06/08/2013   Lifetime risk 31.6%  . History of mammogram 05/09/2014   birad 2;  . History of Papanicolaou smear of cervix 04/27/2013   -/-;  . Increased risk of breast cancer 2015   IBIS=31%  . Urinary tract infection     Family History  Problem Relation Age of Onset  . Breast cancer Mother 72       ER-; no genetic testing done  . Arthritis Mother   . Cancer Mother   . Miscarriages / [place] Mother   . Basal cell carcinoma Father 68  . Hypertension Father   . Diabetes Maternal Grandmother 48  . Heart disease Maternal Grandmother   . Prostate cancer Maternal Grandfather   . Heart disease Maternal Grandfather   . Hypertension Maternal Grandfather   . Diabetes Paternal Grandmother 36  . Heart disease Paternal Grandfather   . Hypertension Paternal Grandfather     Social History   Socioeconomic History  . Marital status: Married    Spouse name: Not on file  . Number of children: Not on file  . Years of education: Not on file  . Highest education level: Not on file  Occupational History  . Not on file  Social Needs  . Financial resource strain: Not on file  . Food insecurity:    Worry: Not on file   Inability: Not on file  . Transportation needs:    Medical: Not on file    Non-medical: Not on file  Tobacco Use  . Smoking status: Never Smoker  . Smokeless tobacco: Never Used  Substance and Sexual Activity  . Alcohol use: Yes    Comment: rarely  . Drug use: No  . Sexual activity: Yes    Birth control/protection: IUD  Lifestyle  . Physical activity:    Days per week: Not on file    Minutes per session: Not on file  . Stress: Not on file  Relationships  . Social connections:    Talks on phone: Not on file    Gets together: Not on file    Attends religious service: Not on file    Active member of club or organization: Not on file    Attends meetings of clubs or organizations: Not on file    Relationship status: Not on file  . Intimate partner violence:    Fear of current or ex partner: Not on file    Emotionally abused: Not on file    Physically abused: Not on file    Forced sexual activity: Not on file  Other Topics Concern  . Not on file  Social History Narrative   Married.   Works  as Geneticist, molecular.   Enjoys playing cello.    Allergies  Allergen Reactions  . Corn-Containing Products   . Ginger   . Arlice Colt Isothiocyanate]   . Sesame Seed (Diagnostic)   . Penicillins Rash     Constitutional: Denies headache, fatigue, fever or abrupt weight changes.  HEENT:  Positive nasal congestion, ear pain and sore throat. Denies eye redness, ringing in the ears, wax buildup, runny nose or bloody nose. Respiratory: Positive cough. Denies difficulty breathing or shortness of breath.  Cardiovascular: Denies chest pain, chest tightness, palpitations or swelling in the hands or feet.   No other specific complaints in a complete review of systems (except as listed in HPI above).  Objective:   BP 128/84   Pulse 91   Temp 98.9 F (37.2 C) (Oral)   Wt 182 lb (82.6 kg)   SpO2 99%   BMI 28.08 kg/m   General: Appears her stated age, in NAD. HEENT: Head: normal shape  and size, no sinus tenderness noted; Ears: Tm's gray and intact, normal light reflex; Throat/Mouth: Teeth present, mucosa erythematous and moist, no exudate noted, no lesions or ulcerations noted.  Neck:  Bilateral anterior cervical adenopathy noted.  Cardiovascular: Normal rate and rhythm. S1,S2 noted.  No murmur, rubs or gallops noted.  Pulmonary/Chest: Normal effort and positive vesicular breath sounds. No respiratory distress. No wheezes, rales or ronchi noted.       Assessment & Plan:   Allergic Rhinitis, Sore Throat:  Continue Singulair, Flonase and Astelin Continue allergy shots RST: negative Do salt water gargles for sore throat 80 mg Depo IM today   RTC as needed or if symptoms persist. Nicki Reaper, NP

## 2017-08-11 ENCOUNTER — Encounter: Payer: Self-pay | Admitting: Obstetrics and Gynecology

## 2017-08-11 ENCOUNTER — Ambulatory Visit (INDEPENDENT_AMBULATORY_CARE_PROVIDER_SITE_OTHER): Payer: BLUE CROSS/BLUE SHIELD | Admitting: Obstetrics and Gynecology

## 2017-08-11 VITALS — BP 122/80 | HR 121 | Ht 67.5 in | Wt 177.0 lb

## 2017-08-11 DIAGNOSIS — F419 Anxiety disorder, unspecified: Secondary | ICD-10-CM

## 2017-08-11 DIAGNOSIS — F329 Major depressive disorder, single episode, unspecified: Secondary | ICD-10-CM

## 2017-08-11 DIAGNOSIS — Z1231 Encounter for screening mammogram for malignant neoplasm of breast: Secondary | ICD-10-CM | POA: Diagnosis not present

## 2017-08-11 DIAGNOSIS — Z30431 Encounter for routine checking of intrauterine contraceptive device: Secondary | ICD-10-CM | POA: Diagnosis not present

## 2017-08-11 DIAGNOSIS — Z01419 Encounter for gynecological examination (general) (routine) without abnormal findings: Secondary | ICD-10-CM

## 2017-08-11 DIAGNOSIS — Z803 Family history of malignant neoplasm of breast: Secondary | ICD-10-CM

## 2017-08-11 DIAGNOSIS — F32A Depression, unspecified: Secondary | ICD-10-CM

## 2017-08-11 DIAGNOSIS — Z1239 Encounter for other screening for malignant neoplasm of breast: Secondary | ICD-10-CM

## 2017-08-11 NOTE — Patient Instructions (Addendum)
I value your feedback and entrusting us with your care. If you get a  patient survey, I would appreciate you taking the time to let us know about your experience today. Thank you!  [facility]: [phone]    

## 2017-08-11 NOTE — Progress Notes (Signed)
Chief Complaint  Patient presents with  . Gynecologic Exam    Talk about anxiety x6 months      HPI:      [name] is a 37 y.o. G0P0 who LMP was No LMP recorded. (Menstrual status: IUD)., presents today for her annual examination. Her menses are irregular spotting with Kyleena. Spotting is very light, usually with wiping only. Lasts 3-4 days but occurs a couple times a month.  Dysmenorrhea none. Pelvic pain/dysmen resolved with IUD. Melasma significantly improved. Happy with it despite bleeding.  Sex activity: single partner, contraception - Kyleena, placed 07/24/16. Last Pap: June 26, 2015  Results were: no abnormalities /neg HPV DNA  Hx of STDs: chlamydia 3/18, neg TOC  Last mammogram: 07/17/16  Results were: normal--routine follow-up in 12 months There is a FH of breast cancer in her mom. Genetic testing not done and pt adamantly declines. IBIS=31%. There is no FH of ovarian cancer. The patient does sometimes do self-breast exams.She does not take Vit D supp but is willing to.  Tobacco use: The patient denies current or previous tobacco use. Alcohol use: social Exercise: very active  She does get adequate calcium but not Vitamin D in her diet.  She has had issues with anxiety/panic attacks this yr due to marital issues. Her sx have improved after talking to therapist and learning how to deal with the circumstances differently. She is not interested in meds for sx and denies SI/HI. Declines doing GAD-7/PHQ-9 because did it with therapist at [facility].   Past Medical History:  Diagnosis Date  . Acid reflux 2019  . Allergic rhinitis   . Allergy   . Anxiety   . Chlamydia 06/2016  . Family history of breast cancer 06/08/2013   Lifetime risk 31.6%  . History of mammogram 05/09/2014   birad 2;  . History of Papanicolaou smear of cervix 04/27/2013   -/-;  . Increased risk of breast cancer 2015   IBIS=31%  . Urinary tract infection     Past  Surgical History:  Procedure Laterality Date  . WISDOM TOOTH EXTRACTION    . WISDOM TOOTH EXTRACTION      Family History  Problem Relation Age of Onset  . Breast cancer Mother 42       ER-; no genetic testing done  . Arthritis Mother   . Cancer Mother   . Miscarriages / [place] Mother   . Basal cell carcinoma Father 77  . Hypertension Father   . Diabetes Maternal Grandmother 48  . Heart disease Maternal Grandmother   . Prostate cancer Maternal Grandfather   . Heart disease Maternal Grandfather   . Hypertension Maternal Grandfather   . Diabetes Paternal Grandmother 79  . Heart disease Paternal Grandfather   . Hypertension Paternal Grandfather     Social History   Socioeconomic History  . Marital status: Married    Spouse name: Not on file  . Number of children: Not on file  . Years of education: Not on file  . Highest education level: Not on file  Occupational History  . Not on file  Social Needs  . Financial resource strain: Not on file  . Food insecurity:    Worry: Not on file    Inability: Not on file  . Transportation needs:    Medical: Not on file    Non-medical: Not on file  Tobacco Use  . Smoking status: Never Smoker  . Smokeless tobacco: Never Used  Substance and  Sexual Activity  . Alcohol use: Yes    Comment: rarely  . Drug use: No  . Sexual activity: Yes    Birth control/protection: IUD    Comment: Kyleena   Lifestyle  . Physical activity:    Days per week: Not on file    Minutes per session: Not on file  . Stress: Not on file  Relationships  . Social connections:    Talks on phone: Not on file    Gets together: Not on file    Attends religious service: Not on file    Active member of club or organization: Not on file    Attends meetings of clubs or organizations: Not on file    Relationship status: Not on file  . Intimate partner violence:    Fear of current or ex partner: Not on file    Emotionally abused: Not on file    Physically  abused: Not on file    Forced sexual activity: Not on file  Other Topics Concern  . Not on file  Social History Narrative   Married.   Works as Geneticist, molecular.   Enjoys playing cello.    Current Outpatient Medications on File Prior to Visit  Medication Sig Dispense Refill  . azelastine (ASTELIN) 0.1 % nasal spray Place into both nostrils 2 (two) times daily. Use in each nostril as directed    . fluticasone (FLONASE) 50 MCG/ACT nasal spray PLACE 1-2 SPRAYS IN EACH NOSTRIL ONCE A DAY  6  . levocetirizine (XYZAL) 5 MG tablet Take 5 mg by mouth every evening.    . montelukast (SINGULAIR) 10 MG tablet Take 10 mg by mouth at bedtime.    . ranitidine (ZANTAC) 150 MG tablet Take 1 tablet (150 mg total) by mouth 2 (two) times daily. 60 tablet 5  . hydroquinone 4 % cream Apply topically 2 (two) times daily. (Patient not taking: Reported on 08/11/2017) 28.35 g 0  . Levonorgestrel (KYLEENA) 19.5 MG IUD 19.5 mg by Intrauterine route once. 1 Intra Uterine Device 0   No current facility-administered medications on file prior to visit.       ROS:  Review of Systems  Constitutional: Negative for fatigue, fever and unexpected weight change.  Respiratory: Negative for cough, shortness of breath and wheezing.   Cardiovascular: Negative for chest pain, palpitations and leg swelling.  Gastrointestinal: Negative for blood in stool, constipation, diarrhea, nausea and vomiting.  Endocrine: Negative for cold intolerance, heat intolerance and polyuria.  Genitourinary: Negative for dyspareunia, dysuria, flank pain, frequency, genital sores, hematuria, menstrual problem, pelvic pain, urgency, vaginal bleeding, vaginal discharge and vaginal pain.  Musculoskeletal: Negative for back pain, joint swelling and myalgias.  Skin: Negative for rash.  Neurological: Negative for dizziness, syncope, light-headedness, numbness and headaches.  Hematological: Negative for adenopathy.  Psychiatric/Behavioral: Negative for  agitation, confusion, sleep disturbance and suicidal ideas. The patient is not nervous/anxious.      Objective: BP 122/80   Pulse (!) 121   Ht 5' 7.5" (1.715 m)   Wt 177 lb (80.3 kg)   BMI 27.31 kg/m    Physical Exam  Constitutional: She is oriented to person, place, and time. She appears well-developed and well-nourished.  Genitourinary: Vagina normal and uterus normal. There is no rash or tenderness on the right labia. There is no rash or tenderness on the left labia. No erythema or tenderness in the vagina. No vaginal discharge found. Right adnexum does not display mass and does not display tenderness. Left adnexum does  not display mass and does not display tenderness.  Cervix exhibits visible IUD strings. Cervix does not exhibit motion tenderness or polyp. Uterus is not enlarged or tender.  Neck: Normal range of motion. No thyromegaly present.  Cardiovascular: Normal rate, regular rhythm and normal heart sounds.  No murmur heard. Pulmonary/Chest: Effort normal and breath sounds normal. Right breast exhibits no mass, no nipple discharge, no skin change and no tenderness. Left breast exhibits no mass, no nipple discharge, no skin change and no tenderness.  Abdominal: Soft. There is no tenderness. There is no guarding.  Musculoskeletal: Normal range of motion.  Neurological: She is alert and oriented to person, place, and time. No cranial nerve deficit.  Psychiatric: She has a normal mood and affect. Her behavior is normal.  Vitals reviewed.   Assessment/Plan: Encounter for annual routine gynecological examination  Encounter for routine checking of intrauterine contraceptive device (IUD) - IUD in place.   Screening for breast cancer - Pt to sched mammo. - Plan: MM 3D SCREEN BREAST BILATERAL  Family history of breast cancer - MyRisk testing discussed and pt declines. IBIS=31%. Recommend monthly SBE, Q6-12 mo CBE, yearly mammo and scr br MRI. Add Vit D3 5,000 IU daily. - Plan: MM 3D  SCREEN BREAST BILATERAL  Anxiety and depression - Situational based sx. Seeing therapist with sx improvement. She and I don't think she needs SSRI right now. Cont exericse/stress mgmt. F/u prn.           GYN counsel breast self exam, mammography screening, adequate intake of calcium and vitamin D, diet and exercise     F/U  Return in about 1 year (around 08/12/2018).  Caydon Feasel B. Sherrod Toothman, PA-C 08/11/2017 11:00 AM

## 2017-09-09 ENCOUNTER — Encounter: Payer: Self-pay | Admitting: Obstetrics and Gynecology

## 2017-10-22 ENCOUNTER — Other Ambulatory Visit: Payer: Self-pay | Admitting: Primary Care

## 2017-10-22 DIAGNOSIS — K219 Gastro-esophageal reflux disease without esophagitis: Secondary | ICD-10-CM

## 2017-11-14 ENCOUNTER — Other Ambulatory Visit: Payer: Self-pay | Admitting: Primary Care

## 2017-11-14 DIAGNOSIS — K219 Gastro-esophageal reflux disease without esophagitis: Secondary | ICD-10-CM

## 2017-12-21 ENCOUNTER — Ambulatory Visit: Payer: BLUE CROSS/BLUE SHIELD | Admitting: Psychology

## 2017-12-21 DIAGNOSIS — F4323 Adjustment disorder with mixed anxiety and depressed mood: Secondary | ICD-10-CM

## 2017-12-28 ENCOUNTER — Ambulatory Visit: Payer: BLUE CROSS/BLUE SHIELD | Admitting: Psychology

## 2017-12-28 DIAGNOSIS — F4323 Adjustment disorder with mixed anxiety and depressed mood: Secondary | ICD-10-CM | POA: Diagnosis not present

## 2018-01-04 ENCOUNTER — Ambulatory Visit: Payer: BLUE CROSS/BLUE SHIELD | Admitting: Psychology

## 2018-01-04 DIAGNOSIS — F4323 Adjustment disorder with mixed anxiety and depressed mood: Secondary | ICD-10-CM

## 2018-01-09 ENCOUNTER — Other Ambulatory Visit: Payer: Self-pay | Admitting: Primary Care

## 2018-01-09 DIAGNOSIS — K219 Gastro-esophageal reflux disease without esophagitis: Secondary | ICD-10-CM

## 2018-01-18 ENCOUNTER — Ambulatory Visit: Payer: BLUE CROSS/BLUE SHIELD | Admitting: Psychology

## 2018-01-18 DIAGNOSIS — F4323 Adjustment disorder with mixed anxiety and depressed mood: Secondary | ICD-10-CM | POA: Diagnosis not present

## 2018-02-01 ENCOUNTER — Ambulatory Visit: Payer: BLUE CROSS/BLUE SHIELD | Admitting: Psychology

## 2018-02-01 DIAGNOSIS — F4323 Adjustment disorder with mixed anxiety and depressed mood: Secondary | ICD-10-CM

## 2018-02-09 ENCOUNTER — Ambulatory Visit: Payer: BLUE CROSS/BLUE SHIELD | Admitting: Psychology

## 2018-02-09 DIAGNOSIS — F4323 Adjustment disorder with mixed anxiety and depressed mood: Secondary | ICD-10-CM

## 2018-02-13 ENCOUNTER — Other Ambulatory Visit: Payer: Self-pay | Admitting: Primary Care

## 2018-02-13 DIAGNOSIS — K219 Gastro-esophageal reflux disease without esophagitis: Secondary | ICD-10-CM

## 2018-02-22 ENCOUNTER — Ambulatory Visit: Payer: BLUE CROSS/BLUE SHIELD | Admitting: Psychology

## 2018-02-22 DIAGNOSIS — F4324 Adjustment disorder with disturbance of conduct: Secondary | ICD-10-CM

## 2018-03-08 ENCOUNTER — Ambulatory Visit: Payer: BLUE CROSS/BLUE SHIELD | Admitting: Psychology

## 2018-03-15 ENCOUNTER — Other Ambulatory Visit: Payer: Self-pay | Admitting: Primary Care

## 2018-03-15 DIAGNOSIS — K219 Gastro-esophageal reflux disease without esophagitis: Secondary | ICD-10-CM

## 2018-03-15 MED ORDER — FAMOTIDINE 20 MG PO TABS: 20.0000 mg | ORAL_TABLET | Freq: Two times a day (BID) | ORAL | 0 refills | Status: DC

## 2018-03-15 NOTE — Addendum Note (Signed)
Addended by: Doreene NestLARK, Nichelle Renwick K on: 03/15/2018 05:54 PM   Modules accepted: Orders

## 2018-03-15 NOTE — Telephone Encounter (Signed)
Please notify patient that I will send in a prescription for famotidine which is generic for Pepcid.  Please have her come see me if her symptoms persist with the switch.

## 2018-03-15 NOTE — Telephone Encounter (Signed)
Pt called back to see what med could be substituted for ranitidine to [facility]. Please advise.

## 2018-03-16 ENCOUNTER — Ambulatory Visit: Payer: BLUE CROSS/BLUE SHIELD | Admitting: Psychology

## 2018-03-16 DIAGNOSIS — F4323 Adjustment disorder with mixed anxiety and depressed mood: Secondary | ICD-10-CM

## 2018-03-16 NOTE — Telephone Encounter (Signed)
Spoken and notified patient of Kate Clark's comments. Patient verbalized understanding.  

## 2018-03-17 ENCOUNTER — Telehealth: Payer: Self-pay | Admitting: *Deleted

## 2018-03-17 NOTE — Telephone Encounter (Signed)
Spoke to pt who states she is experiencing diarrhea and vomiting as of today. She states that she is unable to keep anything down, including water and crackers. She advises that she "can hardly make it to the toilet in time" and feels as though she could "pass out on the toilet." Pt denies any SHoB and dizziness. Informed pt that based on her s/s, she would need to be evaluated in the ED; pt declined, as well as an appt 12/12. Pt is wanting to know what she could do throughout the night, that would assist with her symptoms so she "can at least rest tonight." pls advise

## 2018-03-17 NOTE — Telephone Encounter (Signed)
Please notify patient that I strongly recommend she work on slowly sipping water or Gatorade to prevent dehydration.  I do not recommend she take any antidiarrheal medication. I am most willing to see her tomorrow for evaluation and treatment.

## 2018-03-17 NOTE — Telephone Encounter (Signed)
I spoke with pt and read Kate's recommendation. She said she is still struggling to get fluids down, keeps vomiting but she will sip on liquids. She is scheduled for 10:20am tomorrow.

## 2018-03-18 ENCOUNTER — Ambulatory Visit: Payer: BLUE CROSS/BLUE SHIELD | Admitting: Primary Care

## 2018-03-18 NOTE — Telephone Encounter (Addendum)
Noted and glad to hear! 

## 2018-03-18 NOTE — Telephone Encounter (Signed)
Per DPR, left detail message of Kate Clark's comments for patient to call back 

## 2018-03-18 NOTE — Telephone Encounter (Signed)
Spoken to patient and she stated that she is much better today. Last night at 9 pm was the last time she vomited. Nothing this morning at all. She is resting, drinking water, and chicken broth.

## 2018-03-18 NOTE — Telephone Encounter (Addendum)
Patient was not seen today as scheduled, she must have canceled. Please call and check on patient, how is she doing?

## 2018-04-05 ENCOUNTER — Ambulatory Visit: Payer: BLUE CROSS/BLUE SHIELD | Admitting: Psychology

## 2018-04-05 DIAGNOSIS — F4323 Adjustment disorder with mixed anxiety and depressed mood: Secondary | ICD-10-CM | POA: Diagnosis not present

## 2018-04-20 ENCOUNTER — Other Ambulatory Visit: Payer: Self-pay | Admitting: Primary Care

## 2018-04-20 DIAGNOSIS — K219 Gastro-esophageal reflux disease without esophagitis: Secondary | ICD-10-CM

## 2018-04-26 ENCOUNTER — Ambulatory Visit: Payer: BLUE CROSS/BLUE SHIELD | Admitting: Psychology

## 2019-03-24 ENCOUNTER — Ambulatory Visit: Payer: BLUE CROSS/BLUE SHIELD | Admitting: Obstetrics and Gynecology

## 2019-04-19 NOTE — Progress Notes (Signed)
Chief Complaint  Patient presents with  . Gynecologic Exam     HPI:      Debbie Farmer is a 39 y.o. G0P0 who LMP was No LMP recorded. (Menstrual status: IUD)., presents today for her annual examination. Her menses are irregular light spotting with Kyleena, lasting less than a day.  Dysmenorrhea mild before spotting starts. Pelvic pain/dysmen resolved with IUD.   Sex activity: rarely sex active; contraception - Kyleena, placed 07/24/16. Last Pap: June 26, 2015  Results were: no abnormalities /neg HPV DNA  Hx of STDs: chlamydia 3/18, neg TOC  Last mammogram: 07/17/16  Results were: normal--routine follow-up in 12 months. Would like it this yr. There is a FH of breast cancer in her mom. Genetic testing not done and pt adamantly declines. IBIS=31%. There is no FH of ovarian cancer. The patient does self-breast exams. She does  take Vit D supp.  Tobacco use: The patient denies current or previous tobacco use. Alcohol use: few beers a wk No drug use Exercise: very active  She does get adequate calcium and Vitamin D in her diet.   Past Medical History:  Diagnosis Date  . Acid reflux 2019  . Allergic rhinitis   . Allergy   . Anxiety   . Chlamydia 06/2016  . Family history of breast cancer 06/08/2013   Lifetime risk 31.6%  . History of mammogram 05/09/2014   birad 2;  . History of Papanicolaou smear of cervix 04/27/2013   -/-;  . Increased risk of breast cancer 2015   IBIS=31%  . Urinary tract infection     Past Surgical History:  Procedure Laterality Date  . WISDOM TOOTH EXTRACTION    . WISDOM TOOTH EXTRACTION      Family History  Problem Relation Age of Onset  . Breast cancer Mother 4       ER-; no genetic testing done  . Arthritis Mother   . Miscarriages / [place] Mother   . Basal cell carcinoma Father 42  . Hypertension Father   . Diabetes Maternal Grandmother 50  . Heart disease Maternal Grandmother   . Prostate cancer Maternal  Grandfather   . Heart disease Maternal Grandfather   . Hypertension Maternal Grandfather   . Diabetes Paternal Grandmother 73  . Heart disease Paternal Grandfather   . Hypertension Paternal Grandfather     Social History   Socioeconomic History  . Marital status: Married    Spouse name: Not on file  . Number of children: Not on file  . Years of education: Not on file  . Highest education level: Not on file  Occupational History  . Not on file  Tobacco Use  . Smoking status: Never Smoker  . Smokeless tobacco: Never Used  Substance and Sexual Activity  . Alcohol use: Yes    Comment: rarely  . Drug use: No  . Sexual activity: Yes    Birth control/protection: I.U.D.    Comment: Kyleena   Other Topics Concern  . Not on file  Social History Narrative   Married.   Works as Print production planner.   Enjoys playing cello.   Social Determinants of Health   Financial Resource Strain:   . Difficulty of Paying Living Expenses: Not on file  Food Insecurity:   . Worried About Charity fundraiser in the Last Year: Not on file  . Ran Out of Food in the Last Year: Not on file  Transportation Needs:   . Lack of Transportation (  Medical): Not on file  . Lack of Transportation (Non-Medical): Not on file  Physical Activity:   . Days of Exercise per Week: Not on file  . Minutes of Exercise per Session: Not on file  Stress:   . Feeling of Stress : Not on file  Social Connections:   . Frequency of Communication with Friends and Family: Not on file  . Frequency of Social Gatherings with Friends and Family: Not on file  . Attends Religious Services: Not on file  . Active Member of Clubs or Organizations: Not on file  . Attends Banker Meetings: Not on file  . Marital Status: Not on file  Intimate Partner Violence:   . Fear of Current or Ex-Partner: Not on file  . Emotionally Abused: Not on file  . Physically Abused: Not on file  . Sexually Abused: Not on file    Current  Outpatient Medications on File Prior to Visit  Medication Sig Dispense Refill  . azelastine (ASTELIN) 0.1 % nasal spray Place into both nostrils 2 (two) times daily. Use in each nostril as directed    . famotidine (PEPCID) 20 MG tablet TAKE 1 TABLET BY MOUTH TWICE A DAY 60 tablet 5  . fluticasone (FLONASE) 50 MCG/ACT nasal spray PLACE 1-2 SPRAYS IN EACH NOSTRIL ONCE A DAY  6  . levocetirizine (XYZAL) 5 MG tablet Take 5 mg by mouth every evening.    . montelukast (SINGULAIR) 10 MG tablet Take 10 mg by mouth at bedtime.    . Levonorgestrel (KYLEENA) 19.5 MG IUD 19.5 mg by Intrauterine route once. 1 Intra Uterine Device 0   No current facility-administered medications on file prior to visit.      ROS:  Review of Systems  Constitutional: Negative for fatigue, fever and unexpected weight change.  Respiratory: Negative for cough, shortness of breath and wheezing.   Cardiovascular: Negative for chest pain, palpitations and leg swelling.  Gastrointestinal: Negative for blood in stool, constipation, diarrhea, nausea and vomiting.  Endocrine: Negative for cold intolerance, heat intolerance and polyuria.  Genitourinary: Negative for dyspareunia, dysuria, flank pain, frequency, genital sores, hematuria, menstrual problem, pelvic pain, urgency, vaginal bleeding, vaginal discharge and vaginal pain.  Musculoskeletal: Negative for back pain, joint swelling and myalgias.  Skin: Negative for rash.  Neurological: Negative for dizziness, syncope, light-headedness, numbness and headaches.  Hematological: Negative for adenopathy.  Psychiatric/Behavioral: Negative for agitation, confusion, sleep disturbance and suicidal ideas. The patient is not nervous/anxious.      Objective: BP 110/80   Ht 5' 7.5" (1.715 m)   Wt 164 lb (74.4 kg)   BMI 25.31 kg/m    Physical Exam Constitutional:      Appearance: She is well-developed.  Genitourinary:     Vulva, vagina, uterus, right adnexa and left adnexa  normal.     No vulval lesion or tenderness noted.     No vaginal discharge, erythema or tenderness.     No cervical motion tenderness or polyp.     IUD strings visualized.     Uterus is not enlarged or tender.     No right or left adnexal mass present.     Right adnexa not tender.     Left adnexa not tender.  Neck:     Thyroid: No thyromegaly.  Cardiovascular:     Rate and Rhythm: Normal rate and regular rhythm.     Heart sounds: Normal heart sounds. No murmur.  Pulmonary:     Effort: Pulmonary effort is normal.  Breath sounds: Normal breath sounds.  Chest:     Breasts:        Right: No mass, nipple discharge, skin change or tenderness.        Left: No mass, nipple discharge, skin change or tenderness.  Abdominal:     Palpations: Abdomen is soft.     Tenderness: There is no abdominal tenderness. There is no guarding.  Musculoskeletal:        General: Normal range of motion.     Cervical back: Normal range of motion.  Neurological:     General: No focal deficit present.     Mental Status: She is alert and oriented to person, place, and time.     Cranial Nerves: No cranial nerve deficit.  Skin:    General: Skin is warm and dry.  Psychiatric:        Mood and Affect: Mood normal.        Behavior: Behavior normal.        Thought Content: Thought content normal.        Judgment: Judgment normal.  Vitals reviewed.     Assessment/Plan: Encounter for annual routine gynecological examination  Encounter for routine checking of intrauterine contraceptive device (IUD); IUD in place. Doing well.   Encounter for screening mammogram for malignant neoplasm of breast - Plan: 3D MAMMOGRAM SCREENING BILATERAL; pt to sched mammo  Family history of breast cancer - Plan: 3D MAMMOGRAM SCREENING BILATERAL; Pt aware of MyRisk testing and declines. F/u prn.          GYN counsel breast self exam, mammography screening, adequate intake of calcium and vitamin D, diet and exercise      F/U  Return in about 1 year (around 04/19/2020).  Keithon Mccoin B. Chritopher Coster, PA-C 04/20/2019 9:45 AM

## 2019-04-20 ENCOUNTER — Encounter: Payer: Self-pay | Admitting: Obstetrics and Gynecology

## 2019-04-20 ENCOUNTER — Other Ambulatory Visit: Payer: Self-pay

## 2019-04-20 ENCOUNTER — Ambulatory Visit (INDEPENDENT_AMBULATORY_CARE_PROVIDER_SITE_OTHER): Payer: 59 | Admitting: Obstetrics and Gynecology

## 2019-04-20 VITALS — BP 110/80 | Ht 67.5 in | Wt 164.0 lb

## 2019-04-20 DIAGNOSIS — Z803 Family history of malignant neoplasm of breast: Secondary | ICD-10-CM

## 2019-04-20 DIAGNOSIS — Z01419 Encounter for gynecological examination (general) (routine) without abnormal findings: Secondary | ICD-10-CM | POA: Diagnosis not present

## 2019-04-20 DIAGNOSIS — Z30431 Encounter for routine checking of intrauterine contraceptive device: Secondary | ICD-10-CM

## 2019-04-20 DIAGNOSIS — Z1231 Encounter for screening mammogram for malignant neoplasm of breast: Secondary | ICD-10-CM

## 2019-04-20 NOTE — Patient Instructions (Signed)
I value your feedback and entrusting us with your care. If you get a [facility] patient survey, I would appreciate you taking the time to let us know about your experience today. Thank you! ° °As of March 17, 2019, your lab results will be released to your MyChart immediately, before I even have a chance to see them. Please give me time to review them and contact you if there are any abnormalities. Thank you for your patience.  ° °[facility]: [phone] ° ° ° °

## 2019-04-22 ENCOUNTER — Telehealth: Payer: Self-pay | Admitting: Obstetrics & Gynecology

## 2019-04-22 NOTE — Telephone Encounter (Signed)
Patient is calling to ask for help. Patient was just seen for annual with Helmut Muster for her annual and she was trying to schedule her yearly mammogram at [facility] and she said they would not schedule her appointment until she is 29. Patient is very frustrated and doesn't understand why they aren't scheduling. Would you please advise if there needs to be another order placed due to family history.

## 2019-04-22 NOTE — Telephone Encounter (Signed)
Pls call [facility] and find out why they won't schedule for pt. Fam Hx breast cancer listed as dx on order. Pls then f/u with pt or me. Thx

## 2019-04-22 NOTE — Telephone Encounter (Signed)
Spoke to [facility] at [place] and was able to schedule pts appt Jan 25 at 8:40 am in [place]. Pt is aware. Asher Muir mentioned when pt called to schedule appt they asked her questions in regards to family history of breast cancer in order to be able to schedule the appt and she refused to answer them and was rude, that is why they could not schedule her appt since they needed that info.

## 2019-04-24 NOTE — Telephone Encounter (Signed)
Ok. Appt sched.

## 2019-04-28 ENCOUNTER — Telehealth: Payer: Self-pay

## 2019-04-28 ENCOUNTER — Other Ambulatory Visit: Payer: Self-pay

## 2019-04-28 ENCOUNTER — Ambulatory Visit (INDEPENDENT_AMBULATORY_CARE_PROVIDER_SITE_OTHER): Payer: 59 | Admitting: Primary Care

## 2019-04-28 ENCOUNTER — Encounter: Payer: Self-pay | Admitting: Primary Care

## 2019-04-28 VITALS — BP 116/72 | HR 101 | Temp 96.6°F | Ht 67.5 in | Wt 161.2 lb

## 2019-04-28 DIAGNOSIS — F419 Anxiety disorder, unspecified: Secondary | ICD-10-CM | POA: Diagnosis not present

## 2019-04-28 DIAGNOSIS — Z23 Encounter for immunization: Secondary | ICD-10-CM | POA: Diagnosis not present

## 2019-04-28 DIAGNOSIS — Z803 Family history of malignant neoplasm of breast: Secondary | ICD-10-CM

## 2019-04-28 DIAGNOSIS — Z0000 Encounter for general adult medical examination without abnormal findings: Secondary | ICD-10-CM | POA: Diagnosis not present

## 2019-04-28 DIAGNOSIS — Z0001 Encounter for general adult medical examination with abnormal findings: Secondary | ICD-10-CM | POA: Insufficient documentation

## 2019-04-28 DIAGNOSIS — L219 Seborrheic dermatitis, unspecified: Secondary | ICD-10-CM | POA: Insufficient documentation

## 2019-04-28 DIAGNOSIS — J309 Allergic rhinitis, unspecified: Secondary | ICD-10-CM

## 2019-04-28 LAB — CBC
HCT: 42 % (ref 36.0–46.0)
Hemoglobin: 14.1 g/dL (ref 12.0–15.0)
MCHC: 33.5 g/dL (ref 30.0–36.0)
MCV: 86.9 fl (ref 78.0–100.0)
Platelets: 284 10*3/uL (ref 150.0–400.0)
RBC: 4.83 Mil/uL (ref 3.87–5.11)
RDW: 13.2 % (ref 11.5–15.5)
WBC: 6.2 10*3/uL (ref 4.0–10.5)

## 2019-04-28 LAB — LIPID PANEL
Cholesterol: 177 mg/dL (ref 0–200)
HDL: 65.5 mg/dL (ref >39.00)
LDL Cholesterol: 101 mg/dL — ABNORMAL HIGH (ref 0–99)
NonHDL: 111.62
Total CHOL/HDL Ratio: 3
Triglycerides: 51 mg/dL (ref 0.0–149.0)
VLDL: 10.2 mg/dL (ref 0.0–40.0)

## 2019-04-28 LAB — COMPREHENSIVE METABOLIC PANEL
ALT: 10 U/L (ref 0–35)
AST: 11 U/L (ref 0–37)
Albumin: 4.4 g/dL (ref 3.5–5.2)
Alkaline Phosphatase: 45 U/L (ref 39–117)
BUN: 10 mg/dL (ref 6–23)
CO2: 29 mEq/L (ref 19–32)
Calcium: 9.5 mg/dL (ref 8.4–10.5)
Chloride: 106 mEq/L (ref 96–112)
Creatinine, Ser: 0.8 mg/dL (ref 0.40–1.20)
GFR: 80.82 mL/min (ref >60.00)
Glucose, Bld: 108 mg/dL — ABNORMAL HIGH (ref 70–99)
Potassium: 5.4 mEq/L — ABNORMAL HIGH (ref 3.5–5.1)
Sodium: 138 mEq/L (ref 135–145)
Total Bilirubin: 0.4 mg/dL (ref 0.2–1.2)
Total Protein: 6.8 g/dL (ref 6.0–8.3)

## 2019-04-28 NOTE — Assessment & Plan Note (Signed)
Chronic and intermittent, using OTC hydrocortisone with improvement.

## 2019-04-28 NOTE — Addendum Note (Signed)
Addended by: Tawnya Crook on: 04/28/2019 12:38 PM   Modules accepted: Orders

## 2019-04-28 NOTE — Progress Notes (Signed)
Subjective:    Patient ID: Debbie Farmer, female    DOB: April 02, 1981, 39 y.o.   MRN: [id number]  HPI  This visit occurred during the SARS-CoV-2 public health emergency.  Safety protocols were in place, including screening questions prior to the visit, additional usage of staff PPE, and extensive cleaning of exam room while observing appropriate contact time as indicated for disinfecting solutions.   Debbie Farmer is a 39 year old female who presents today for complete physical.  Immunizations: -Tetanus: Completed in 2008 -Influenza: Completed this season   Diet: She endorses a healthy diet. Exercise: She is not exercising much now  Eye exam: No recent exam Dental exam: Completes semi-annually   Pap Smear: Completed in 2017, follows with GYN due again in 2022  BP Readings from Last 3 Encounters:  04/28/19 116/72  04/20/19 110/80  08/11/17 122/80   Wt Readings from Last 3 Encounters:  04/28/19 161 lb 4 oz (73.1 kg)  04/20/19 164 lb (74.4 kg)  08/11/17 177 lb (80.3 kg)     Review of Systems  Constitutional: Negative for unexpected weight change.  HENT: Negative for rhinorrhea.   Respiratory: Negative for cough and shortness of breath.   Cardiovascular: Negative for chest pain.  Gastrointestinal: Negative for constipation and diarrhea.  Genitourinary: Negative for difficulty urinating and menstrual problem.  Musculoskeletal: Negative for arthralgias and myalgias.  Skin: Positive for rash.       Intermittent seborrheic dermatitis   Allergic/Immunologic: Positive for environmental allergies.  Neurological: Negative for dizziness, numbness and headaches.  Psychiatric/Behavioral: The patient is not nervous/anxious.        Past Medical History:  Diagnosis Date  . Acid reflux 2019  . Allergic rhinitis   . Allergy   . Anxiety   . Chlamydia 06/2016  . Family history of breast cancer 06/08/2013   Lifetime risk 31.6%  . History of mammogram 05/09/2014   birad 2;  .  History of Papanicolaou smear of cervix 04/27/2013   -/-;  . Increased risk of breast cancer 2015   IBIS=31%  . Urinary tract infection      Social History   Socioeconomic History  . Marital status: Married    Spouse name: Not on file  . Number of children: Not on file  . Years of education: Not on file  . Highest education level: Not on file  Occupational History  . Not on file  Tobacco Use  . Smoking status: Never Smoker  . Smokeless tobacco: Never Used  Substance and Sexual Activity  . Alcohol use: Yes    Comment: rarely  . Drug use: No  . Sexual activity: Yes    Birth control/protection: I.U.D.    Comment: Debbie Farmer   Other Topics Concern  . Not on file  Social History Narrative   Married.   Works as Print production planner.   Enjoys playing cello.   Social Determinants of Health   Financial Resource Strain:   . Difficulty of Paying Living Expenses: Not on file  Food Insecurity:   . Worried About Charity fundraiser in the Last Year: Not on file  . Ran Out of Food in the Last Year: Not on file  Transportation Needs:   . Lack of Transportation (Medical): Not on file  . Lack of Transportation (Non-Medical): Not on file  Physical Activity:   . Days of Exercise per Week: Not on file  . Minutes of Exercise per Session: Not on file  Stress:   . Feeling  of Stress : Not on file  Social Connections:   . Frequency of Communication with Friends and Family: Not on file  . Frequency of Social Gatherings with Friends and Family: Not on file  . Attends Religious Services: Not on file  . Active Member of Clubs or Organizations: Not on file  . Attends Banker Meetings: Not on file  . Marital Status: Not on file  Intimate Partner Violence:   . Fear of Current or Ex-Partner: Not on file  . Emotionally Abused: Not on file  . Physically Abused: Not on file  . Sexually Abused: Not on file    Past Surgical History:  Procedure Laterality Date  . WISDOM TOOTH EXTRACTION     . WISDOM TOOTH EXTRACTION      Family History  Problem Relation Age of Onset  . Breast cancer Mother 66       ER-; no genetic testing done  . Arthritis Mother   . Miscarriages / [place] Mother   . Basal cell carcinoma Father 84  . Hypertension Father   . Diabetes Maternal Grandmother 48  . Heart disease Maternal Grandmother   . Prostate cancer Maternal Grandfather   . Heart disease Maternal Grandfather   . Hypertension Maternal Grandfather   . Diabetes Paternal Grandmother 46  . Heart disease Paternal Grandfather   . Hypertension Paternal Grandfather     Allergies  Allergen Reactions  . Corn-Containing Products   . Ginger   . Arlice Colt Isothiocyanate]   . Sesame Seed (Diagnostic)   . Penicillins Rash    Current Outpatient Medications on File Prior to Visit  Medication Sig Dispense Refill  . azelastine (ASTELIN) 0.1 % nasal spray Place into both nostrils 2 (two) times daily. Use in each nostril as directed    . famotidine (PEPCID) 20 MG tablet TAKE 1 TABLET BY MOUTH TWICE A DAY 60 tablet 5  . fluticasone (FLONASE) 50 MCG/ACT nasal spray PLACE 1-2 SPRAYS IN EACH NOSTRIL ONCE A DAY  6  . levocetirizine (XYZAL) 5 MG tablet Take 5 mg by mouth every evening.    . montelukast (SINGULAIR) 10 MG tablet Take 10 mg by mouth at bedtime.    . Levonorgestrel (Debbie Farmer) 19.5 MG IUD 19.5 mg by Intrauterine route once. 1 Intra Uterine Device 0   No current facility-administered medications on file prior to visit.    BP 116/72   Pulse (!) 101   Temp (!) 96.6 F (35.9 C) (Temporal)   Ht 5' 7.5" (1.715 m)   Wt 161 lb 4 oz (73.1 kg)   SpO2 97%   BMI 24.88 kg/m    Objective:   Physical Exam  Constitutional: She is oriented to person, place, and time. She appears well-nourished.  HENT:  Right Ear: Tympanic membrane and ear canal normal.  Left Ear: Tympanic membrane and ear canal normal.  Mouth/Throat: Oropharynx is clear and moist.  Eyes: Pupils are equal, round, and  reactive to light. EOM are normal.  Cardiovascular: Normal rate and regular rhythm.  Respiratory: Effort normal and breath sounds normal.  GI: Soft. Bowel sounds are normal. There is no abdominal tenderness.  Musculoskeletal:        General: Normal range of motion.     Cervical back: Neck supple.  Neurological: She is alert and oriented to person, place, and time. No cranial nerve deficit.  Reflex Scores:      Patellar reflexes are 2+ on the right side and 2+ on the left side. Skin:  Skin is warm and dry.  Psychiatric: She has a normal mood and affect.           Assessment & Plan:

## 2019-04-28 NOTE — Telephone Encounter (Signed)
Noted. It was the call for covid screening

## 2019-04-28 NOTE — Assessment & Plan Note (Signed)
Tetanus due, provided today. Influenza vaccination UTD. Pap smear UTD. Mammogram pending. Commended her on a healthy diet, encouraged regular exercise. Exam today unremarkable. Labs pending.

## 2019-04-28 NOTE — Patient Instructions (Addendum)
Stop by the lab prior to leaving today. I will notify you of your results once received.   Start exercising. You should be getting 150 minutes of moderate intensity exercise weekly.  Continue to work on a healthy diet. Ensure you are consuming 64 ounces of water daily.  It was a pleasure to see you today!   Preventive Care 17-37 Years Old, Female Preventive care refers to visits with your health care provider and lifestyle choices that can promote health and wellness. This includes:  A yearly physical exam. This may also be called an annual well check.  Regular dental visits and eye exams.  Immunizations.  Screening for certain conditions.  Healthy lifestyle choices, such as eating a healthy diet, getting regular exercise, not using drugs or products that contain nicotine and tobacco, and limiting alcohol use. What can I expect for my preventive care visit? Physical exam Your health care provider will check your:  Height and weight. This may be used to calculate body mass index (BMI), which tells if you are at a healthy weight.  Heart rate and blood pressure.  Skin for abnormal spots. Counseling Your health care provider may ask you questions about your:  Alcohol, tobacco, and drug use.  Emotional well-being.  Home and relationship well-being.  Sexual activity.  Eating habits.  Work and work Statistician.  Method of birth control.  Menstrual cycle.  Pregnancy history. What immunizations do I need?  Influenza (flu) vaccine  This is recommended every year. Tetanus, diphtheria, and pertussis (Tdap) vaccine  You may need a Td booster every 10 years. Varicella (chickenpox) vaccine  You may need this if you have not been vaccinated. Human papillomavirus (HPV) vaccine  If recommended by your health care provider, you may need three doses over 6 months. Measles, mumps, and rubella (MMR) vaccine  You may need at least one dose of MMR. You may also need a second  dose. Meningococcal conjugate (MenACWY) vaccine  One dose is recommended if you are age 37-21 years and a first-year college student living in a residence hall, or if you have one of several medical conditions. You may also need additional booster doses. Pneumococcal conjugate (PCV13) vaccine  You may need this if you have certain conditions and were not previously vaccinated. Pneumococcal polysaccharide (PPSV23) vaccine  You may need one or two doses if you smoke cigarettes or if you have certain conditions. Hepatitis A vaccine  You may need this if you have certain conditions or if you travel or work in places where you may be exposed to hepatitis A. Hepatitis B vaccine  You may need this if you have certain conditions or if you travel or work in places where you may be exposed to hepatitis B. Haemophilus influenzae type b (Hib) vaccine  You may need this if you have certain conditions. You may receive vaccines as individual doses or as more than one vaccine together in one shot (combination vaccines). Talk with your health care provider about the risks and benefits of combination vaccines. What tests do I need?  Blood tests  Lipid and cholesterol levels. These may be checked every 5 years starting at age 11.  Hepatitis C test.  Hepatitis B test. Screening  Diabetes screening. This is done by checking your blood sugar (glucose) after you have not eaten for a while (fasting).  Sexually transmitted disease (STD) testing.  BRCA-related cancer screening. This may be done if you have a family history of breast, ovarian, tubal, or peritoneal cancers.  Pelvic exam and Pap test. This may be done every 3 years starting at age 21. Starting at age 30, this may be done every 5 years if you have a Pap test in combination with an HPV test. Talk with your health care provider about your test results, treatment options, and if necessary, the need for more tests. Follow these instructions at  home: Eating and drinking   Eat a diet that includes fresh fruits and vegetables, whole grains, lean protein, and low-fat dairy.  Take vitamin and mineral supplements as recommended by your health care provider.  Do not drink alcohol if: ? Your health care provider tells you not to drink. ? You are pregnant, may be pregnant, or are planning to become pregnant.  If you drink alcohol: ? Limit how much you have to 0-1 drink a day. ? Be aware of how much alcohol is in your drink. In the [place] one drink equals one 12 oz bottle of beer (355 mL), one 5 oz glass of wine (148 mL), or one 1 oz glass of hard liquor (44 mL). Lifestyle  Take daily care of your teeth and gums.  Stay active. Exercise for at least 30 minutes on 5 or more days each week.  Do not use any products that contain nicotine or tobacco, such as cigarettes, e-cigarettes, and chewing tobacco. If you need help quitting, ask your health care provider.  If you are sexually active, practice safe sex. Use a condom or other form of birth control (contraception) in order to prevent pregnancy and STIs (sexually transmitted infections). If you plan to become pregnant, see your health care provider for a preconception visit. What's next?  Visit your health care provider once a year for a well check visit.  Ask your health care provider how often you should have your eyes and teeth checked.  Stay up to date on all vaccines. This information is not intended to replace advice given to you by your health care provider. Make sure you discuss any questions you have with your health care provider. Document Revised: 12/03/2017 Document Reviewed: 12/03/2017 Elsevier Patient Education  2020 Elsevier Inc.  

## 2019-04-28 NOTE — Telephone Encounter (Signed)
[facility] Night - Client Nonclinical Telephone Record AccessNurse Client [facility] Night - Client Client Site [facility] [place] - Night Physician Vernona Rieger - NP Contact Type Call Who Is Calling Patient / Member / Family / Caregiver Caller Name Debbie Farmer Phone Number [phone] Call Type Message Only Information Provided Reason for Call Returning a Call from the Office Initial Comment Caller states she is returning a call from the office. Additional Comment Caller declined triage. Office hours provided. Disp. Time Disposition Final User 04/27/2019 5:22:30 PM General Information Provided Yes Katherine Basset Call Closed By: Katherine Basset Transaction Date/Time: 04/27/2019 5:19:45 PM (ET)

## 2019-04-28 NOTE — Telephone Encounter (Signed)
Pt already seen 04/28/19 at 9 AM. FYI to [facility] CMA.

## 2019-04-28 NOTE — Assessment & Plan Note (Signed)
Improved significantly since separation from husband.  Continue to monitor.

## 2019-04-28 NOTE — Assessment & Plan Note (Addendum)
Overall stable on current regimen, will be starting [facility] through [facility].

## 2019-04-28 NOTE — Assessment & Plan Note (Signed)
Scheduled for mammogram through GYN.

## 2019-05-02 ENCOUNTER — Ambulatory Visit
Admission: RE | Admit: 2019-05-02 | Discharge: 2019-05-02 | Disposition: A | Discharge status: Home / Self Care | Payer: 59 | Source: Ambulatory Visit | Attending: Obstetrics and Gynecology | Admitting: Obstetrics and Gynecology

## 2019-05-02 ENCOUNTER — Encounter: Payer: Self-pay | Admitting: Obstetrics and Gynecology

## 2019-05-02 DIAGNOSIS — Z1231 Encounter for screening mammogram for malignant neoplasm of breast: Secondary | ICD-10-CM | POA: Diagnosis not present

## 2019-05-02 DIAGNOSIS — Z803 Family history of malignant neoplasm of breast: Secondary | ICD-10-CM | POA: Diagnosis present

## 2019-05-03 DIAGNOSIS — E875 Hyperkalemia: Secondary | ICD-10-CM

## 2019-05-03 NOTE — Telephone Encounter (Signed)
Pt called and I scheduled her lab for 05-12-19.

## 2019-05-09 ENCOUNTER — Telehealth: Payer: Self-pay

## 2019-05-09 NOTE — Telephone Encounter (Signed)
LVM w COVID screen and back lab info 2.1.2021 TLJ 

## 2019-05-12 ENCOUNTER — Other Ambulatory Visit (INDEPENDENT_AMBULATORY_CARE_PROVIDER_SITE_OTHER): Payer: 59

## 2019-05-12 ENCOUNTER — Other Ambulatory Visit: Payer: Self-pay

## 2019-05-12 DIAGNOSIS — E875 Hyperkalemia: Secondary | ICD-10-CM | POA: Diagnosis not present

## 2019-05-12 LAB — POTASSIUM: Potassium: 4.6 mEq/L (ref 3.5–5.1)

## 2019-07-28 ENCOUNTER — Telehealth: Payer: Self-pay

## 2019-07-28 NOTE — Telephone Encounter (Signed)
Yes it was the pts husband who tested + for covid on 07/27/19, Pt notified as instructed and voiced understanding. If pt condition changes she will notify [facility].

## 2019-07-28 NOTE — Telephone Encounter (Addendum)
Pt said her husband started on 07/23/19 with fever, chills, body aches,prod cough and lost of taste. Pt's husband got + covid test on 07/27/19. Pt had both her pfizer covid vaccines; one on 06/10/19 and the second vaccine on 07/08/19 at [facility] [address] (immunization list updated). Pt said per CDC since pt is not having any symptoms, pt has had both covid vaccines pt can go to work 07/28/19 at 3pm today.pt has not been covid tested. Pt wants to verify this is correct with Allayne Gitelman NP. Pt request ASAP.

## 2019-07-28 NOTE — Telephone Encounter (Signed)
Please clarify that patient's husband tested positive for Covid on 07/27/2019, not patient.  If she has no symptoms and has completed her Covid vaccine series then she should be fine to return to work.

## 2021-03-23 ENCOUNTER — Ambulatory Visit
Admission: EM | Admit: 2021-03-23 | Discharge: 2021-03-23 | Disposition: A | Discharge status: Home / Self Care | Payer: 59 | Attending: Emergency Medicine | Admitting: Emergency Medicine

## 2021-03-23 ENCOUNTER — Encounter: Payer: Self-pay | Admitting: Emergency Medicine

## 2021-03-23 DIAGNOSIS — R030 Elevated blood-pressure reading, without diagnosis of hypertension: Secondary | ICD-10-CM

## 2021-03-23 DIAGNOSIS — L03012 Cellulitis of left finger: Secondary | ICD-10-CM

## 2021-03-23 MED ORDER — DOXYCYCLINE HYCLATE 100 MG PO CAPS: 100.0000 mg | ORAL_CAPSULE | Freq: Two times a day (BID) | ORAL | 0 refills | Status: AC

## 2021-03-23 NOTE — ED Triage Notes (Signed)
Pt here with left thumb pain and swelling x 2 days

## 2021-03-23 NOTE — ED Provider Notes (Signed)
Renaldo Fiddler    CSN: [id number] Arrival date & time: 03/23/21  1218      History   Chief Complaint Chief Complaint  Patient presents with   Finger Swelling    HPI Debbie Farmer is a 40 y.o. female.  Patient presents with 2-week history of pain, swelling, redness of her left thumb beside the fingernail. No open wound or drainage.  No trauma. No fever, numbness, weakness, paresthesias, or other symptoms.  Treatment attempted at home with warm soaks.    The history is provided by the patient and medical records.   Past Medical History:  Diagnosis Date   Acid reflux 2019   Allergic rhinitis    Allergy    Anxiety    Chlamydia 06/2016   Family history of breast cancer 06/08/2013   Lifetime risk 31.6%   History of mammogram 05/09/2014   birad 2;   History of Papanicolaou smear of cervix 04/27/2013   -/-;   Increased risk of breast cancer 2015   IBIS=31%   Urinary tract infection     Patient Active Problem List   Diagnosis Date Noted   Seborrheic dermatitis 04/28/2019   Preventative health care 04/28/2019   Anxiety 04/16/2017   Right upper quadrant abdominal pain 04/16/2017   Increased risk of breast cancer 06/26/2016   Allergic rhinitis    Family history of breast cancer 06/08/2013    Past Surgical History:  Procedure Laterality Date   WISDOM TOOTH EXTRACTION     WISDOM TOOTH EXTRACTION      OB History     Gravida  0   Para  0   Term  0   Preterm  0   AB  0   Living  0      SAB  0   IAB  0   Ectopic  0   Multiple  0   Live Births  0            Home Medications    Prior to Admission medications   Medication Sig Start Date End Date Taking? Authorizing Provider  doxycycline (VIBRAMYCIN) 100 MG capsule Take 1 capsule (100 mg total) by mouth 2 (two) times daily for 10 days. 03/23/21 04/02/21 Yes Mickie Bail, NP  azelastine (ASTELIN) 0.1 % nasal spray Place into both nostrils 2 (two) times daily. Use in each nostril as  directed    [provider]  famotidine (PEPCID) 20 MG tablet TAKE 1 TABLET BY MOUTH TWICE A DAY 04/20/18   Doreene Nest, NP  fluticasone (FLONASE) 50 MCG/ACT nasal spray PLACE 1-2 SPRAYS IN EACH NOSTRIL ONCE A DAY 04/19/16   [provider]  levocetirizine (XYZAL) 5 MG tablet Take 5 mg by mouth every evening.    [provider]  Levonorgestrel (KYLEENA) 19.5 MG IUD 19.5 mg by Intrauterine route once. 08/21/16 08/21/16  Copland, Ilona Sorrel, PA-C  montelukast (SINGULAIR) 10 MG tablet Take 10 mg by mouth at bedtime.    [provider]    Family History Family History  Problem Relation Age of Onset   Breast cancer Mother 44       ER-; no genetic testing done   Arthritis Mother    Miscarriages / [place] Mother    Basal cell carcinoma Father 30   Hypertension Father    Diabetes Maternal Grandmother 48   Heart disease Maternal Grandmother    Prostate cancer Maternal Grandfather    Heart disease Maternal Grandfather    Hypertension  Maternal Grandfather    Diabetes Paternal Grandmother 36   Heart disease Paternal Grandfather    Hypertension Paternal Grandfather     Social History Social History   Tobacco Use   Smoking status: Never   Smokeless tobacco: Never  Vaping Use   Vaping Use: Never used  Substance Use Topics   Alcohol use: Yes    Comment: rarely   Drug use: No     Allergies   Corn-containing products, Ginger, Mustard [allyl isothiocyanate], Sesame seed (diagnostic), and Penicillins   Review of Systems Review of Systems  Constitutional:  Negative for chills and fever.  Musculoskeletal:  Negative for arthralgias and joint swelling.  Skin:  Positive for color change and wound.  Neurological:  Negative for weakness and numbness.  All other systems reviewed and are negative.   Physical Exam Triage Vital Signs ED Triage Vitals  Enc Vitals Group     BP      Pulse      Resp      Temp      Temp src      SpO2      Weight       Height      Head Circumference      Peak Flow      Pain Score      Pain Loc      Pain Edu?      Excl. in GC?    No data found.  Updated Vital Signs BP (!) 143/88    Pulse (!) 115    Temp 98.1 F (36.7 C) (Oral)    Resp 18    SpO2 98%   Visual Acuity Right Eye Distance:   Left Eye Distance:   Bilateral Distance:    Right Eye Near:   Left Eye Near:    Bilateral Near:     Physical Exam Vitals and nursing note reviewed.  Constitutional:      General: She is not in acute distress.    Appearance: She is well-developed.  Cardiovascular:     Rate and Rhythm: Normal rate and regular rhythm.     Heart sounds: Normal heart sounds.  Pulmonary:     Effort: Pulmonary effort is normal. No respiratory distress.     Breath sounds: Normal breath sounds.  Musculoskeletal:        General: Swelling present. No deformity. Normal range of motion.       Hands:     Cervical back: Neck supple.  Skin:    General: Skin is warm and dry.     Capillary Refill: Capillary refill takes less than 2 seconds.     Findings: Erythema and lesion present.     Comments: Left thumb erythematous, mildly edematous, tender beside nail. No open wound or drainage.   Neurological:     General: No focal deficit present.     Mental Status: She is alert and oriented to person, place, and time.     Sensory: No sensory deficit.     Motor: No weakness.  Psychiatric:        Mood and Affect: Mood normal.        Behavior: Behavior normal.     UC Treatments / Results  Labs (all labs ordered are listed, but only abnormal results are displayed) Labs Reviewed - No data to display  EKG   Radiology No results found.  Procedures Incision and Drainage  Date/Time: 03/23/2021 1:54 PM Performed by: Mickie Bail, NP Authorized by: Wendee Beavers  H, NP   Consent:    Consent obtained:  Verbal   Consent given by:  Patient   Risks discussed:  Bleeding, incomplete drainage, pain and infection Universal  protocol:    Procedure explained and questions answered to patient or proxy's satisfaction: yes   Location:    Indications for incision and drainage: paronychia.   Location:  Upper extremity   Upper extremity location:  Finger   Finger location:  L thumb Pre-procedure details:    Skin preparation:  Povidone-iodine Anesthesia:    Anesthesia method:  Local infiltration   Local anesthetic:  Lidocaine 1% w/o epi Procedure type:    Complexity:  Simple Procedure details:    Incision types:  Single straight   Drainage:  Purulent   Drainage amount:  Moderate   Wound treatment:  Wound left open   Packing materials:  None Post-procedure details:    Procedure completion:  Tolerated well, no immediate complications (including critical care time)  Medications Ordered in UC Medications - No data to display  Initial Impression / Assessment and Plan / UC Course  I have reviewed the triage vital signs and the nursing notes.  Pertinent labs & imaging results that were available during my care of the patient were reviewed by me and considered in my medical decision making (see chart for details).  Paronychia of left thumb.  Elevated blood pressure reading.  I&D performed.  Treating with doxycycline.  Wound care instructions and signs of worsening infection discussed.  Instructed patient to return here if she notes signs of worsening infection.  Also discussed that her blood pressure is elevated today and needs to be rechecked by her PCP in 2 to 4 weeks.  Patient agrees to plan of care.   Final Clinical Impressions(s) / UC Diagnoses   Final diagnoses:  Paronychia of thumb, left  Elevated blood pressure reading     Discharge Instructions      Take the antibiotic as directed.  Keep your wound clean and dry.  Wash it gently twice a day with soap and water.  Apply an antibiotic cream and bandage twice a day.    Return here if you see signs of worsening infection, such as increased pain,  redness, fever, chills, or other concerning symptoms.    Your blood pressure is elevated today at 159/101; repeat 143/88.  Please have this rechecked by your primary care provider in 2-4 weeks.          ED Prescriptions     Medication Sig Dispense Auth. Provider   doxycycline (VIBRAMYCIN) 100 MG capsule Take 1 capsule (100 mg total) by mouth 2 (two) times daily for 10 days. 20 capsule Mickie Bail, NP      PDMP not reviewed this encounter.   Mickie Bail, NP 03/23/21 1356

## 2021-03-23 NOTE — Discharge Instructions (Addendum)
Take the antibiotic as directed.  Keep your wound clean and dry.  Wash it gently twice a day with soap and water.  Apply an antibiotic cream and bandage twice a day.    Return here if you see signs of worsening infection, such as increased pain, redness, fever, chills, or other concerning symptoms.    Your blood pressure is elevated today at 159/101; repeat 143/88.  Please have this rechecked by your primary care provider in 2-4 weeks.

## 2021-05-01 ENCOUNTER — Other Ambulatory Visit: Payer: Self-pay

## 2021-05-01 ENCOUNTER — Ambulatory Visit (INDEPENDENT_AMBULATORY_CARE_PROVIDER_SITE_OTHER): Payer: Self-pay | Admitting: Obstetrics and Gynecology

## 2021-05-01 ENCOUNTER — Encounter: Payer: Self-pay | Admitting: Obstetrics and Gynecology

## 2021-05-01 VITALS — BP 120/70 | Ht 67.5 in | Wt 187.0 lb

## 2021-05-01 DIAGNOSIS — N3001 Acute cystitis with hematuria: Secondary | ICD-10-CM

## 2021-05-01 DIAGNOSIS — R102 Pelvic and perineal pain: Secondary | ICD-10-CM

## 2021-05-01 DIAGNOSIS — Z30432 Encounter for removal of intrauterine contraceptive device: Secondary | ICD-10-CM

## 2021-05-01 LAB — POCT URINALYSIS DIPSTICK
Bilirubin, UA: NEGATIVE
Glucose, UA: NEGATIVE
Ketones, UA: NEGATIVE
Nitrite, UA: NEGATIVE
Protein, UA: NEGATIVE
Spec Grav, UA: 1.01 (ref 1.010–1.025)
pH, UA: 7 (ref 5.0–8.0)

## 2021-05-01 MED ORDER — NITROFURANTOIN MONOHYD MACRO 100 MG PO CAPS: 100.0000 mg | ORAL_CAPSULE | Freq: Two times a day (BID) | ORAL | 0 refills | Status: AC

## 2021-05-01 NOTE — Progress Notes (Signed)
Debbie Nest, NP   Chief Complaint  Patient presents with   IUD check    Severe cramping in pelvic area that started last night    HPI:      Debbie Farmer is a 41 y.o. G0P0000 whose LMP was No LMP recorded. (Menstrual status: IUD)., presents today for severe pelvic pain that started spontaneously in the night. Is intense/stabbing, intermittent. Also with pelvic pressure that comes and goes. No aggrav factors, no allev factors. Hasn't tried NSAIDs, heating pad. No urin sx, no vag sx, no GI sx, no LBP, no fevers.  Has Kyleena, placed 07/24/16. Has occas light spotting with IUD with mild dysmen before bleeding starts. Pelvic pain/dysmen improved with IUD overall.  She is not currently sex active.   Pap due.  SHE IS SELF PAY  Patient Active Problem List   Diagnosis Date Noted   Seborrheic dermatitis 04/28/2019   Preventative health care 04/28/2019   Anxiety 04/16/2017   Right upper quadrant abdominal pain 04/16/2017   Increased risk of breast cancer 06/26/2016   Allergic rhinitis    Family history of breast cancer 06/08/2013    Past Surgical History:  Procedure Laterality Date   WISDOM TOOTH EXTRACTION     WISDOM TOOTH EXTRACTION      Family History  Problem Relation Age of Onset   Breast cancer Mother 39       ER-; no genetic testing done   Arthritis Mother    Miscarriages / [place] Mother    Basal cell carcinoma Father 8   Hypertension Father    Diabetes Maternal Grandmother 48   Heart disease Maternal Grandmother    Prostate cancer Maternal Grandfather    Heart disease Maternal Grandfather    Hypertension Maternal Grandfather    Diabetes Paternal Grandmother 30   Heart disease Paternal Grandfather    Hypertension Paternal Grandfather     Social History   Socioeconomic History   Marital status: Married    Spouse name: Not on file   Number of children: Not on file   Years of education: Not on file   Highest education level: Not on  file  Occupational History   Not on file  Tobacco Use   Smoking status: Never   Smokeless tobacco: Never  Vaping Use   Vaping Use: Never used  Substance and Sexual Activity   Alcohol use: Yes    Comment: rarely   Drug use: No   Sexual activity: Yes    Birth control/protection: I.U.D.    Comment: Kyleena   Other Topics Concern   Not on file  Social History Narrative   Married.   Works as Geneticist, molecular.   Enjoys playing cello.   Social Determinants of Health   Financial Resource Strain: Not on file  Food Insecurity: Not on file  Transportation Needs: Not on file  Physical Activity: Not on file  Stress: Not on file  Social Connections: Not on file  Intimate Partner Violence: Not on file    Outpatient Medications Prior to Visit  Medication Sig Dispense Refill   famotidine (PEPCID) 20 MG tablet TAKE 1 TABLET BY MOUTH TWICE A DAY 60 tablet 5   fluticasone (FLONASE) 50 MCG/ACT nasal spray PLACE 1-2 SPRAYS IN EACH NOSTRIL ONCE A DAY  6   levocetirizine (XYZAL) 5 MG tablet Take 5 mg by mouth every evening.     Levonorgestrel (KYLEENA) 19.5 MG IUD 19.5 mg by Intrauterine route once. 1 Intra Uterine Device 0  azelastine (ASTELIN) 0.1 % nasal spray Place into both nostrils 2 (two) times daily. Use in each nostril as directed     montelukast (SINGULAIR) 10 MG tablet Take 10 mg by mouth at bedtime.     No facility-administered medications prior to visit.      ROS:  Review of Systems  Constitutional:  Negative for fever.  Gastrointestinal:  Negative for blood in stool, constipation, diarrhea, nausea and vomiting.  Genitourinary:  Positive for pelvic pain. Negative for dyspareunia, dysuria, flank pain, frequency, hematuria, urgency, vaginal bleeding, vaginal discharge and vaginal pain.  Musculoskeletal:  Negative for back pain.  Skin:  Negative for rash.  BREAST: No symptoms   OBJECTIVE:   Vitals:  BP 120/70    Ht 5' 7.5" (1.715 m)    Wt 187 lb (84.8 kg)    BMI 28.86  kg/m   Physical Exam Vitals reviewed.  Constitutional:      Appearance: She is well-developed.  Pulmonary:     Effort: Pulmonary effort is normal.  Abdominal:     Palpations: Abdomen is soft.     Tenderness: There is abdominal tenderness in the right lower quadrant. There is no guarding or rebound.    Genitourinary:    General: Normal vulva.     Pubic Area: No rash.      Labia:        Right: No rash, tenderness or lesion.        Left: No rash, tenderness or lesion.      Vagina: Normal. No vaginal discharge, erythema, tenderness or bleeding.     Cervix: Normal.     Uterus: Normal. Not enlarged and not tender.      Adnexa: Left adnexa normal.       Right: Tenderness present. No mass.         Left: No mass or tenderness.       Comments: IUD STRINGS IN CX OS Musculoskeletal:        General: Normal range of motion.     Cervical back: Normal range of motion.  Skin:    General: Skin is warm and dry.  Neurological:     General: No focal deficit present.     Mental Status: She is alert and oriented to person, place, and time.  Psychiatric:        Mood and Affect: Mood normal.        Behavior: Behavior normal.        Thought Content: Thought content normal.        Judgment: Judgment normal.    Results: Results for orders placed or performed in visit on 05/01/21 (from the past 24 hour(s))  POCT Urinalysis Dipstick     Status: Abnormal   Collection Time: 05/01/21 11:22 AM  Result Value Ref Range   Color, UA yellow    Clarity, UA clear    Glucose, UA Negative Negative   Bilirubin, UA neg    Ketones, UA neg    Spec Grav, UA 1.010 1.010 - 1.025   Blood, UA small    pH, UA 7.0 5.0 - 8.0   Protein, UA Negative Negative   Urobilinogen, UA     Nitrite, UA neg    Leukocytes, UA Small (1+) (A) Negative   Appearance     Odor      IUD Removal Strings of IUD identified and grasped.  IUD removed without problem with ring forceps.  Pt tolerated this well.  IUD noted to be  intact.  Assessment/Plan: Pelvic pain - Plan: POCT Urinalysis Dipstick, Urine Culture; sx started spontaneously in the night. Diff dx include UTI, IUD malposition, ovar cyst. Will treat for UTI and remove IUD (due for removal 4/23 anyway). Pt to f/u if sx persist/worsen for GYN u/s, may need STAT.   Acute cystitis with hematuria - Plan: POCT Urinalysis Dipstick, Urine Culture, nitrofurantoin, macrocrystal-monohydrate, (MACROBID) 100 MG capsule; pos UA, no other sx. Rx macrobid. Check C&S. F/u prn.   Encounter for IUD removal--tolerated well. Will go to [facility] for replacement IUD since self-pay. Not sexually active. F/u prn.   DO PAP AT [facility] WITH IUD INSERTION  Meds ordered this encounter  Medications   nitrofurantoin, macrocrystal-monohydrate, (MACROBID) 100 MG capsule    Sig: Take 1 capsule (100 mg total) by mouth 2 (two) times daily for 5 days.    Dispense:  10 capsule    Refill:  0    Order Specific Question:   Supervising Provider    Answer:   Gae Dry [id number]   PT IS SELF PAY    Return if symptoms worsen or fail to improve.  Skya Mccullum B. Caspar Favila, PA-C 05/01/2021 11:29 AM

## 2021-05-02 ENCOUNTER — Telehealth: Payer: Self-pay | Admitting: Obstetrics and Gynecology

## 2021-05-02 NOTE — Telephone Encounter (Signed)
Pt is scheduled for [facility] placement with [facility] on 2/21.

## 2021-05-02 NOTE — Telephone Encounter (Signed)
Noted. Will order to arrive by apt date/time. 

## 2021-05-03 LAB — URINE CULTURE

## 2021-05-10 ENCOUNTER — Encounter: Payer: Self-pay | Admitting: Family Medicine

## 2021-05-10 ENCOUNTER — Ambulatory Visit: Payer: Self-pay

## 2021-05-10 ENCOUNTER — Ambulatory Visit (LOCAL_COMMUNITY_HEALTH_CENTER): Payer: Self-pay | Admitting: Family Medicine

## 2021-05-10 ENCOUNTER — Other Ambulatory Visit: Payer: Self-pay

## 2021-05-10 VITALS — BP 125/86 | Ht 68.0 in | Wt 186.8 lb

## 2021-05-10 DIAGNOSIS — Z01419 Encounter for gynecological examination (general) (routine) without abnormal findings: Secondary | ICD-10-CM

## 2021-05-10 DIAGNOSIS — Z3009 Encounter for other general counseling and advice on contraception: Secondary | ICD-10-CM

## 2021-05-10 DIAGNOSIS — Z1272 Encounter for screening for malignant neoplasm of vagina: Secondary | ICD-10-CM

## 2021-05-10 DIAGNOSIS — Z113 Encounter for screening for infections with a predominantly sexual mode of transmission: Secondary | ICD-10-CM

## 2021-05-10 DIAGNOSIS — Z803 Family history of malignant neoplasm of breast: Secondary | ICD-10-CM

## 2021-05-10 LAB — HM HIV SCREENING LAB: HM HIV Screening: NEGATIVE

## 2021-05-10 LAB — WET PREP FOR TRICH, YEAST, CLUE
Trichomonas Exam: NEGATIVE
Yeast Exam: NEGATIVE

## 2021-05-10 NOTE — Progress Notes (Signed)
Pt here for PE. Wet mount results reviewed, no treatment required per SO.  IUD consultation completed and consent form signed.  Pt scheduled for IUD insertion on 05/22/2021.  Pt informed to take Ibuprofen 800 mg, before procedure.  [facility] information given to pt.  Condoms declined.

## 2021-05-13 NOTE — Progress Notes (Signed)
[facility] DEPARTMENT [facility] [address] Main Number: [phone]    Family Planning Visit- Initial Visit  Subjective:  Debbie Farmer is a 41 y.o.  G0P0000   being seen today for an initial annual visit and to discuss reproductive life planning.  The patient is currently using No Method - No Contraceptive Precautions for pregnancy prevention. Patient reports   does not want a pregnancy in the next year.  Patient has the following medical conditions has Allergic rhinitis; Family history of breast cancer; Increased risk of breast cancer; Anxiety; Right upper quadrant abdominal pain; Seborrheic dermatitis; and Preventative health care on their problem list.  Chief Complaint  Patient presents with   Annual Exam   Contraception    Patient reports her for PE and IUD insertion   Patient denies any problems or concerns    Body mass index is 28.4 kg/m. - Patient is eligible for diabetes screening based on BMI and age >35?  not applicable HA1C ordered? not applicable  Patient reports 1  partner/s in last year. Desires STI screening?  Yes  Has patient been screened once for HCV in the past?  No  No results found for: HCVAB  Does the patient have current drug use (including MJ), have a partner with drug use, and/or has been incarcerated since last result? No  If yes-- Screen for HCV through [facility]   Does the patient meet criteria for HBV testing? No  Criteria:  -Household, sexual or needle sharing contact with HBV -History of drug use -HIV positive -Those with known Hep C   Health Maintenance Due  Topic Date Due   HIV Screening  Never done   Hepatitis C Screening  Never done   COVID-19 Vaccine (3 - Booster for Pfizer series) 09/02/2019   INFLUENZA VACCINE  11/05/2020    ROS  The following portions of the patient's history were reviewed and updated as appropriate: allergies, current medications, past family history, past  medical history, past social history, past surgical history and problem list. Problem list updated.   See flowsheet for other program required questions.  Objective:   Vitals:   05/10/21 0926 05/10/21 0935  BP: (!) 141/90 125/86  Weight: 186 lb 12.8 oz (84.7 kg)   Height: 5\' 8"  (1.727 m)     Physical Exam Vitals and nursing note reviewed.  Constitutional:      Appearance: Normal appearance.  HENT:     Head: Normocephalic and atraumatic.     Mouth/Throat:     Mouth: Mucous membranes are moist.     Dentition: Normal dentition. No dental caries.     Pharynx: No oropharyngeal exudate or posterior oropharyngeal erythema.  Eyes:     General: No scleral icterus. Neck:     Thyroid: No thyroid mass, thyromegaly or thyroid tenderness.  Cardiovascular:     Rate and Rhythm: Normal rate and regular rhythm.     Pulses: Normal pulses.     Heart sounds: Normal heart sounds.  Pulmonary:     Effort: Pulmonary effort is normal.     Breath sounds: Normal breath sounds.  Chest:     Comments: Breasts:        Right: Normal. No swelling, mass, nipple discharge, skin change or tenderness.        Left: Normal. No swelling, mass, nipple discharge, skin change or tenderness.   Abdominal:     General: Abdomen is flat. Bowel sounds are normal.  Palpations: Abdomen is soft.  Genitourinary:    General: Normal vulva.     Rectum: Normal.     Comments: External genitalia without, lice, nits, erythema, edema , lesions or inguinal adenopathy. Vagina with normal mucosa and discharge and pH equals 4.  Cervix without visual lesions, uterus firm, mobile, non-tender, no masses, CMT adnexal fullness or tenderness.   Musculoskeletal:        General: Normal range of motion.     Cervical back: Normal range of motion and neck supple.  Lymphadenopathy:     Cervical: No cervical adenopathy.  Skin:    General: Skin is warm and dry.  Neurological:     General: No focal deficit present.     Mental Status: She  is alert and oriented to person, place, and time.  Psychiatric:        Mood and Affect: Mood normal.        Behavior: Behavior normal.      Assessment and Plan:  Debbie Farmer is a 41 y.o. female presenting to the [facility] Department for an initial annual wellness/contraceptive visit    1. Smear, vaginal, as part of routine gynecological examination Well woman exam  CBE today  PAP today   - IGP, Aptima HPV  2. Screening examination for venereal disease Discussed with patient about  - HIV [place] [facility] - Chlamydia/Gonorrhea [facility] - WET PREP FOR TRICH, YEAST, CLUE - Syphilis Serology, [facility]  3. Family history of breast cancer Mother dx with BCA @ 19, pt last mammogram was 2 years ago.  D/t loss of insurance.  Referred to [facility] for mammogram   4. Family planning counseling Contraception counseling: Reviewed all forms of birth control options in the tiered based approach. available including abstinence; over the counter/barrier methods; hormonal contraceptive medication including pill, patch, ring, injection,contraceptive implant, ECP; hormonal and nonhormonal IUDs; permanent sterilization options including vasectomy and the various tubal sterilization modalities. Risks, benefits, and typical effectiveness rates were reviewed.  Questions were answered.  Written information was also given to the patient to review.  Patient desires No Method - Other Reason, this was prescribed for patient.    The patient will follow up in  2 weeks for IUD insertion.   The patient was told to call with any further questions, or with any concerns about this method of contraception.  Emphasized use of condoms 100% of the time for STI prevention.  Patient was not offered ECP based on last sex.      No follow-ups on file.  Future Appointments  Date Time Provider Department Center  05/22/2021 10:00 AM [facility] PROVIDER [facility] None  05/28/2021 10:55 AM Copland, Ilona Sorrel,  PA-C [facility] None    Wendi Snipes, FNP

## 2021-05-15 LAB — IGP, APTIMA HPV
HPV Aptima: NEGATIVE
PAP Smear Comment: 0

## 2021-05-22 ENCOUNTER — Other Ambulatory Visit: Payer: Self-pay

## 2021-05-22 ENCOUNTER — Encounter: Payer: Self-pay | Admitting: Advanced Practice Midwife

## 2021-05-22 ENCOUNTER — Ambulatory Visit (LOCAL_COMMUNITY_HEALTH_CENTER): Payer: Self-pay | Admitting: Advanced Practice Midwife

## 2021-05-22 VITALS — BP 113/73 | HR 98 | Temp 97.7°F | Ht 68.0 in | Wt 186.0 lb

## 2021-05-22 DIAGNOSIS — E663 Overweight: Secondary | ICD-10-CM

## 2021-05-22 DIAGNOSIS — Z3009 Encounter for other general counseling and advice on contraception: Secondary | ICD-10-CM

## 2021-05-22 DIAGNOSIS — Z30018 Encounter for initial prescription of other contraceptives: Secondary | ICD-10-CM

## 2021-05-22 MED ORDER — LEVONORGESTREL 20 MCG/DAY IU IUD
1.0000 | INTRAUTERINE_SYSTEM | Freq: Once | INTRAUTERINE | Status: AC
  Administered 2021-05-22: 1 via INTRAUTERINE

## 2021-05-22 NOTE — Progress Notes (Signed)
[facility] [address] Main Number: [phone]  Contraception/Family Planning VISIT ENCOUNTER NOTE  Subjective:   Debbie Farmer is a 41 y.o. MWF nonsmoker [id number] female here for reproductive life counseling. The patient is currently using Abstinence to prevent pregnancy.  Desires Mirena for [facility].  The patient does not want a pregnancy in the next year.  Last PE 05/10/21. PE at [facility] on 04/11/21 and Kyleena removed at that apt. Last sex 02/2021 without condom; with current partner x 8 years. Brown spotting since IUD removed. Wants Mirena inserted today.    Denies abnormal vaginal bleeding, discharge, pelvic pain, problems with intercourse or other gynecologic concerns.    Gynecologic History No LMP recorded (lmp unknown).  Health Maintenance Due  Topic Date Due   Hepatitis C Screening  Never done   COVID-19 Vaccine (3 - Booster for Pfizer series) 09/02/2019   INFLUENZA VACCINE  11/05/2020     The following portions of the patient's history were reviewed and updated as appropriate: allergies, current medications, past family history, past medical history, past social history, past surgical history and problem list.  Review of Systems Pertinent items are noted in HPI.   Objective:  BP 113/73    Pulse 98    Temp 97.7 F (36.5 C)    Ht 5\' 8"  (1.727 m)    Wt 186 lb (84.4 kg)    LMP  (LMP Unknown) Comment: Kyleena IUD removed 04/11/2021 pt reports some bleeding but no periods yet   BMI 28.28 kg/m  Gen: well appearing, NAD HEENT: no scleral icterus CV: RR Lung: Normal WOB Ext: warm well perfused  PELVIC: Normal appearing external genitalia; normal appearing vaginal mucosa and cervix.  No abnormal discharge noted.   Normal uterine size, no other palpable masses, no uterine or adnexal tenderness.   Assessment and Plan:   Contraception counseling: Reviewed all forms of birth control options in the tiered based approach.  available including abstinence; over the counter/barrier methods; hormonal contraceptive medication including pill, patch, ring, injection,contraceptive implant, ECP; hormonal and nonhormonal IUDs; permanent sterilization options including vasectomy and the various tubal sterilization modalities. Risks, benefits, and typical effectiveness rates were reviewed.  Questions were answered.  Written information was also given to the patient to review.  Patient desires Mirena, this was prescribed for patient. She will follow up in  4 wks for string check or 1 year for surveillance.  She was told to call with any further questions, or with any concerns about this method of contraception.  Emphasized use of condoms 100% of the time for STI prevention.  Patient was not offered ECP due to not meeting criteria. ECP was not accepted by the patient. ECP counseling was not given - see RN documentation  1. Family planning GC/Chlamydia and pap done 05/10/21 neg HPV neg  2. Encounter for initial prescription of other contraceptives    Patient presented to [facility] for IUD insertion. Her GC/CT screening was found to be up to date and using WHO criteria we can be reasonably certain she is not pregnant or a pregnancy test was obtained which was Urine pregnancy test  today was N/A.  See Flowsheet for IUD check list  IUD Insertion Procedure Note Patient identified, informed consent performed, consent signed.   Discussed risks of irregular bleeding, cramping, infection, malpositioning or misplacement of the IUD outside the uterus which may require further procedure such as laparoscopy. Time out was performed.    Speculum placed in  the vagina.  Cervix visualized.  Cleaned with Betadine x 2.  Cervix Grasped anteriorly with a single tooth tenaculum.  Uterus sounded to 7 cm.  IUD placed per manufacturer's recommendations.  Strings trimmed to 3 cm. Tenaculum was removed, good hemostasis noted.  Patient tolerated procedure well.    Patient was given post-procedure instructions- both agency handout and verbally by provider.  She was advised to have backup contraception for one week.  Patient was also asked to check IUD strings periodically or follow up in 4 weeks for IUD check.  Pt counseled on abstinance next 7 days - levonorgestrel (MIRENA) 20 MCG/DAY IUD 1 each    Please refer to After Visit Summary for other counseling recommendations.   No follow-ups on file.  Alberteen Spindle, CNM [facility] DEPARTMENT

## 2021-05-28 ENCOUNTER — Ambulatory Visit: Payer: Self-pay | Admitting: Obstetrics and Gynecology

## 2021-07-17 ENCOUNTER — Other Ambulatory Visit: Payer: Self-pay

## 2021-07-17 DIAGNOSIS — Z1231 Encounter for screening mammogram for malignant neoplasm of breast: Secondary | ICD-10-CM

## 2021-07-24 ENCOUNTER — Ambulatory Visit
Admission: RE | Admit: 2021-07-24 | Discharge: 2021-07-24 | Disposition: A | Discharge status: Home / Self Care | Payer: Self-pay | Source: Ambulatory Visit | Attending: Obstetrics and Gynecology | Admitting: Obstetrics and Gynecology

## 2021-07-24 ENCOUNTER — Ambulatory Visit: Payer: Self-pay | Attending: Hematology and Oncology | Admitting: *Deleted

## 2021-07-24 VITALS — BP 131/85 | HR 96 | Temp 98.9°F | Wt 189.6 lb

## 2021-07-24 DIAGNOSIS — Z1231 Encounter for screening mammogram for malignant neoplasm of breast: Secondary | ICD-10-CM | POA: Insufficient documentation

## 2021-07-24 DIAGNOSIS — Z1239 Encounter for other screening for malignant neoplasm of breast: Secondary | ICD-10-CM

## 2021-07-24 NOTE — Progress Notes (Signed)
Debbie Farmer is a 41 y.o. female who presents to [facility] clinic today with no complaints.  ?  ?Pap Smear: Pap smear not completed today. Last Pap smear was 05/10/2021 at [facility] and was normal with negative HPV. Per patient has no history of an abnormal Pap smear. Last Pap smear result is available in Epic. ?  ?Physical exam: ?Breasts ?Breasts symmetrical. No skin abnormalities bilateral breasts. No nipple retraction bilateral breasts. No nipple discharge bilateral breasts. No lymphadenopathy. No lumps palpated bilateral breasts. No complaints of pain or tenderness on exam.    ? ?3D MAMMOGRAM SCREENING BILATERAL ? ?Result Date: 05/02/2019 ?CLINICAL DATA:  Screening. EXAM: DIGITAL SCREENING BILATERAL MAMMOGRAM WITH TOMO AND CAD COMPARISON:  Previous exam(s). ACR Breast Density Category c: The breast tissue is heterogeneously dense, which may obscure small masses. FINDINGS: There are no findings suspicious for malignancy. Images were processed with CAD. IMPRESSION: No mammographic evidence of malignancy. A result letter of this screening mammogram will be mailed directly to the patient. RECOMMENDATION: Screening mammogram at age 31. (Code:SM-B-40A) BI-RADS CATEGORY  1: Negative. Electronically Signed   By: Curlene Dolphin M.D.   On: 05/02/2019 16:37   ? ?Pelvic/Bimanual ?Pap is not indicated today per BCCCP guidelines. ?  ?Smoking History: ?Patient has never smoked. ?  ?Patient Navigation: ?Patient education provided. Access to services provided for patient through [facility] program.  ? ?Colorectal Cancer Screening: ?Per patient has never had colonoscopy completed. No complaints today.  ?  ?Breast and Cervical Cancer Risk Assessment: ?Patient has family history of her mother having breast cancer at age 51. Patient has no known genetic mutations or history of radiation treatment to the chest before age 38. Patient does not have history of cervical dysplasia, immunocompromised, or DES  exposure in-utero. ? ?Risk Assessment   ? ? Risk Scores   ? ?   07/24/2021  ? Last edited by: Johney Maine, RN  ? 5-year risk: 1 %  ? Lifetime risk: 18.9 %  ? ?  ?  ? ?  ? ? ?A: ?BCCCP exam without pap smear ?No complaints. ? ?P: ?Referred patient to the [facility] for a screening mammogram. Appointment scheduled Wednesday, July 24, 2021 at 1120. ? ?Loletta Parish, RN ?07/24/2021 10:47 AM   ?

## 2021-07-24 NOTE — Patient Instructions (Signed)
Explained breast self awareness with Debbie Farmer. Patient did not need a Pap smear today due to last Pap smear and HPV typing was 05/10/2021. Let her know BCCCP will cover Pap smears and HPV typing every 5 years unless has a history of abnormal Pap smears. Referred patient to the [facility] for a screening mammogram. Appointment scheduled Wednesday, July 24, 2021 at 1120. Patient aware of appointment and will be there. Let patient know Delford Field will follow up with her within the next couple weeks with results of her mammogram by letter or phone. Debbie Farmer verbalized understanding. ? ?Debbie Farmer, Debbie Maser, RN ?10:47 AM ? ? ? ? ?

## 2022-01-21 ENCOUNTER — Encounter: Payer: Self-pay | Admitting: Primary Care

## 2022-01-21 ENCOUNTER — Ambulatory Visit (INDEPENDENT_AMBULATORY_CARE_PROVIDER_SITE_OTHER): Payer: No Typology Code available for payment source | Admitting: Primary Care

## 2022-01-21 VITALS — BP 138/88 | HR 105 | Temp 97.9°F | Ht 68.0 in | Wt 193.0 lb

## 2022-01-21 DIAGNOSIS — M25441 Effusion, right hand: Secondary | ICD-10-CM

## 2022-01-21 DIAGNOSIS — G8929 Other chronic pain: Secondary | ICD-10-CM

## 2022-01-21 DIAGNOSIS — M2550 Pain in unspecified joint: Secondary | ICD-10-CM | POA: Diagnosis not present

## 2022-01-21 LAB — TSH: TSH: 2.46 u[IU]/mL (ref 0.35–5.50)

## 2022-01-21 LAB — C-REACTIVE PROTEIN: CRP: <1 mg/dL (ref 0.5–20.0)

## 2022-01-21 LAB — SEDIMENTATION RATE: Sed Rate: 5 mm/hr (ref 0–20)

## 2022-01-21 LAB — URIC ACID: Uric Acid, Serum: 3.9 mg/dL (ref 2.4–7.0)

## 2022-01-21 NOTE — Progress Notes (Signed)
Subjective:    Patient ID: Debbie Farmer, female    DOB: [birth year], 41 y.o.   MRN: [id number]  HPI  Debbie Farmer is a very pleasant 41 y.o. female with a history of seborrheic dermatitis, anxiety who presents today to discuss joint pain.   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 She also plays the piano daily for about 30 minutes daily which requires a lot of dexterity as she plays complex pieces. Occasionally and historically, she's noticed intermittent joint pain, mostly to the 5th digits at the DIP joint. Over the last two weeks she's noticed joint swelling, decrease in ROM, and redness to the right 5th DIP joint.   Family history of arthritis in her mother. She denies injury, trauma, tick bites, or any provoking event. She's noticed a gradual disfigurement to the 5th digits at the DIP joint.   She's not taken anything OTC for symptoms.    Review of Systems  Musculoskeletal:  Positive for arthralgias and joint swelling.  Skin:  Positive for color change.         Past Medical History:  Diagnosis Date   Acid reflux 2019   Allergic rhinitis    Allergy    Anxiety    Chlamydia 06/2016   Family history of breast cancer 06/08/2013   Lifetime risk 31.6%   History of mammogram 05/09/2014   birad 2;   History of Papanicolaou smear of cervix 04/27/2013   -/-;   Increased risk of breast cancer 2015   IBIS=31%   Urinary tract infection     Social History   Socioeconomic History   Marital status: Married    Spouse name: Not on file   Number of children: Not on file   Years of education: Not on file   Highest education level: Not on file  Occupational History   Not on file  Tobacco Use   Smoking status: Never   Smokeless tobacco: Never  Vaping Use   Vaping Use: Never used  Substance and Sexual Activity   Alcohol use: Yes    Comment: rarely   Drug use: Never   Sexual activity: Yes    Birth control/protection: Abstinence    Comment: Kyleena    Other Topics Concern   Not on file  Social History Narrative   Married.   Works as Print production planner.   Enjoys playing cello.   Social Determinants of Health   Financial Resource Strain: Not on file  Food Insecurity: Not on file  Transportation Needs: Not on file  Physical Activity: Not on file  Stress: Not on file  Social Connections: Not on file  Intimate Partner Violence: Not At Risk (05/10/2021)   Humiliation, Afraid, Rape, and Kick questionnaire    Fear of Current or Ex-Partner: No    Emotionally Abused: No    Physically Abused: No    Sexually Abused: No    Past Surgical History:  Procedure Laterality Date   WISDOM TOOTH EXTRACTION     WISDOM TOOTH EXTRACTION      Family History  Problem Relation Age of Onset   Breast cancer Mother 14       ER-; no genetic testing done   Arthritis Mother    Miscarriages / [place] Mother    Basal cell carcinoma Father 81   Hypertension Father    Diabetes Maternal Grandmother 33   Heart disease Maternal Grandmother    Prostate cancer Maternal Grandfather    Heart disease Maternal Grandfather  Hypertension Maternal Grandfather    Diabetes Paternal Grandmother 54   Heart disease Paternal Grandfather    Hypertension Paternal Grandfather     Allergies  Allergen Reactions   Corn-Containing Products    Ginger    Mustard [Allyl Isothiocyanate]    Sesame Seed (Diagnostic)    Penicillins Rash    Current Outpatient Medications on File Prior to Visit  Medication Sig Dispense Refill   famotidine (PEPCID) 20 MG tablet TAKE 1 TABLET BY MOUTH TWICE A DAY 60 tablet 5   fluticasone (FLONASE) 50 MCG/ACT nasal spray PLACE 1-2 SPRAYS IN EACH NOSTRIL ONCE A DAY  6   levocetirizine (XYZAL) 5 MG tablet Take 5 mg by mouth every evening.     levonorgestrel (MIRENA) 20 MCG/DAY IUD 1 each by Intrauterine route once.     No current facility-administered medications on file prior to visit.    BP 138/88   Pulse (!) 105   Temp 97.9 F (36.6  C) (Temporal)   Ht 5\' 8"  (1.727 m)   Wt 193 lb (87.5 kg)   SpO2 99%   BMI 29.35 kg/m  Objective:   Physical Exam Constitutional:      General: She is not in acute distress. Cardiovascular:     Rate and Rhythm: Normal rate and regular rhythm.  Pulmonary:     Effort: Pulmonary effort is normal.     Breath sounds: Normal breath sounds.  Musculoskeletal:     Comments: Decrease in ROM to right 5th DIP joint with mild swelling. Normal ROM to joints of other digits. Strength to right 5th digit intact.   Neurological:     Mental Status: She is alert.           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  Checking labs today including uric acid and other labs to evaluate for RA. Discussed to minimize recurrent use of hands/fingers when able.  Discussed use of Voltaren Gel PRN.  Await results.       Relevant Orders   Uric acid   TSH   Cyclic citrul peptide antibody, IgG   C-reactive protein   ANA   Rheumatoid factor   Sedimentation rate   Other Visit Diagnoses     Swelling of finger joint of right hand       Relevant Orders   Uric acid   TSH   Cyclic citrul peptide antibody, IgG   C-reactive protein   ANA   Rheumatoid factor   Sedimentation rate          , NP

## 2022-01-21 NOTE — Patient Instructions (Signed)
Stop by the lab prior to leaving today. I will notify you of your results once received.   Try diclofenac (Voltaren) gel for pain and inflammation. You can find this at a drugstore like [facility], [facility], etc.  Set up your annual physical for February 2024.  It was a pleasure to see you today!

## 2022-01-21 NOTE — Assessment & Plan Note (Signed)
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  Checking labs today including uric acid and other labs to evaluate for RA. Discussed to minimize recurrent use of hands/fingers when able.  Discussed use of Voltaren Gel PRN.  Await results.

## 2022-01-23 LAB — RHEUMATOID FACTOR: Rheumatoid fact SerPl-aCnc: <14 IU/mL (ref <14)

## 2022-01-23 LAB — CYCLIC CITRUL PEPTIDE ANTIBODY, IGG: Cyclic Citrullin Peptide Ab: <16 UNITS

## 2022-01-23 LAB — ANA: Anti Nuclear Antibody (ANA): NEGATIVE

## 2022-05-08 ENCOUNTER — Encounter: Payer: Self-pay | Admitting: Primary Care

## 2022-05-08 ENCOUNTER — Ambulatory Visit (INDEPENDENT_AMBULATORY_CARE_PROVIDER_SITE_OTHER): Payer: BLUE CROSS/BLUE SHIELD | Admitting: Primary Care

## 2022-05-08 VITALS — BP 134/86 | HR 90 | Temp 97.3°F | Ht 68.0 in | Wt 190.0 lb

## 2022-05-08 DIAGNOSIS — J0100 Acute maxillary sinusitis, unspecified: Secondary | ICD-10-CM

## 2022-05-08 HISTORY — DX: Acute maxillary sinusitis, unspecified: J01.00

## 2022-05-08 MED ORDER — AZITHROMYCIN 250 MG PO TABS: ORAL_TABLET | ORAL | 0 refills | Status: DC

## 2022-05-08 NOTE — Progress Notes (Signed)
Subjective:    Patient ID: Debbie Farmer, female    DOB: 1980-06-14, 42 y.o.   MRN: [id number]  HPI  Debbie Farmer is a very pleasant 42 y.o. female with a history of allergic rhinitis who presents today to discuss nasal congestion.  Symptom onset 2 weeks ago with sore throat.  She then developed cough, sneezing, nasal congestion.  Since then she has developed maxillary sinus pressure, otalgia, jaw/upper dental pain, a sensation of fluid in her head.  She has been taking Sudafed, Alka Seltzer Day, antihistamine, Flonsae, and Afrin. No recent use of Afrin.   She has not tested for COVID. She's pulling clear and dark yellow mucous from her nasal cavity. Overall she feels that she's not overcoming her sickness.   Review of Systems  HENT:  Positive for congestion, ear pain, sinus pressure and sinus pain. Negative for sore throat.   Respiratory:  Positive for cough.   Neurological:  Positive for headaches.         Past Medical History:  Diagnosis Date   Acid reflux 2019   Allergic rhinitis    Allergy    Anxiety    Chlamydia 06/2016   Family history of breast cancer 06/08/2013   Lifetime risk 31.6%   History of mammogram 05/09/2014   birad 2;   History of Papanicolaou smear of cervix 04/27/2013   -/-;   Increased risk of breast cancer 2015   IBIS=31%   Urinary tract infection     Social History   Socioeconomic History   Marital status: Married    Spouse name: Not on file   Number of children: Not on file   Years of education: Not on file   Highest education level: Not on file  Occupational History   Not on file  Tobacco Use   Smoking status: Never   Smokeless tobacco: Never  Vaping Use   Vaping Use: Never used  Substance and Sexual Activity   Alcohol use: Yes    Comment: rarely   Drug use: Never   Sexual activity: Yes    Birth control/protection: Abstinence    Comment: Kyleena   Other Topics Concern   Not on file  Social History Narrative    Married.   Works as Print production planner.   Enjoys playing cello.   Social Determinants of Health   Financial Resource Strain: Not on file  Food Insecurity: Not on file  Transportation Needs: Not on file  Physical Activity: Not on file  Stress: Not on file  Social Connections: Not on file  Intimate Partner Violence: Not At Risk (05/10/2021)   Humiliation, Afraid, Rape, and Kick questionnaire    Fear of Current or Ex-Partner: No    Emotionally Abused: No    Physically Abused: No    Sexually Abused: No    Past Surgical History:  Procedure Laterality Date   WISDOM TOOTH EXTRACTION     WISDOM TOOTH EXTRACTION      Family History  Problem Relation Age of Onset   Breast cancer Mother 55       ER-; no genetic testing done   Arthritis Mother    Miscarriages / [place] Mother    Basal cell carcinoma Father 31   Hypertension Father    Diabetes Maternal Grandmother 23   Heart disease Maternal Grandmother    Prostate cancer Maternal Grandfather    Heart disease Maternal Grandfather    Hypertension Maternal Grandfather    Diabetes Paternal Grandmother 55   Heart  disease Paternal Grandfather    Hypertension Paternal Grandfather     Allergies  Allergen Reactions   Corn-Containing Products    Ginger    Mustard [Allyl Isothiocyanate]    Sesame Seed (Diagnostic)    Penicillins Rash    Current Outpatient Medications on File Prior to Visit  Medication Sig Dispense Refill   famotidine (PEPCID) 20 MG tablet TAKE 1 TABLET BY MOUTH TWICE A DAY 60 tablet 5   fluticasone (FLONASE) 50 MCG/ACT nasal spray PLACE 1-2 SPRAYS IN EACH NOSTRIL ONCE A DAY  6   levocetirizine (XYZAL) 5 MG tablet Take 5 mg by mouth every evening.     levonorgestrel (MIRENA) 20 MCG/DAY IUD 1 each by Intrauterine route once.     No current facility-administered medications on file prior to visit.    BP 134/86   Pulse 90   Temp (!) 97.3 F (36.3 C) (Temporal)   Ht 5\' 8"  (1.727 m)   Wt 190 lb (86.2 kg)   SpO2  99%   BMI 28.89 kg/m  Objective:   Physical Exam Constitutional:      Appearance: She is ill-appearing.  HENT:     Right Ear: Tympanic membrane and ear canal normal.     Left Ear: Tympanic membrane and ear canal normal.     Nose:     Right Sinus: Maxillary sinus tenderness present. No frontal sinus tenderness.     Left Sinus: Maxillary sinus tenderness present. No frontal sinus tenderness.     Mouth/Throat:     Pharynx: No posterior oropharyngeal erythema.  Eyes:     Conjunctiva/sclera: Conjunctivae normal.  Cardiovascular:     Rate and Rhythm: Normal rate and regular rhythm.  Pulmonary:     Effort: Pulmonary effort is normal.     Breath sounds: Normal breath sounds. No wheezing or rales.  Musculoskeletal:     Cervical back: Neck supple.  Lymphadenopathy:     Cervical: No cervical adenopathy.  Skin:    General: Skin is warm and dry.           Assessment & Plan:  Acute non-recurrent maxillary sinusitis Assessment & Plan: It's possible that she may have had Covid earlier, but now she seems to have developed a sinus infection.  Start Azithromycin antibiotics for infection. Take 2 tablets by mouth today, then 1 tablet daily for 4 additional days. PCN allergy.  Continue Flonase and antihistamine.  Follow up PRN.  Orders: -     Azithromycin; Take 2 tablets by mouth today, then 1 tablet daily for 4 additional days.  Dispense: 6 tablet; Refill: 0        Pleas Koch, NP

## 2022-05-08 NOTE — Assessment & Plan Note (Signed)
It's possible that she may have had Covid earlier, but now she seems to have developed a sinus infection.  Start Azithromycin antibiotics for infection. Take 2 tablets by mouth today, then 1 tablet daily for 4 additional days. PCN allergy.  Continue Flonase and antihistamine.  Follow up PRN.

## 2022-05-26 IMAGING — MG MM DIGITAL SCREENING BILAT W/ TOMO AND CAD
8 series · 8 of 24 positions shown · non-contrast
Comparison: Previous exam(s).

CLINICAL DATA: Screening.

EXAM:
DIGITAL SCREENING BILATERAL MAMMOGRAM WITH TOMOSYNTHESIS AND CAD
TECHNIQUE: Bilateral screening digital craniocaudal and mediolateral oblique
mammograms were obtained. Bilateral screening digital breast
tomosynthesis was performed. The images were evaluated with
computer-aided detection.

[L MLO synth-2D]
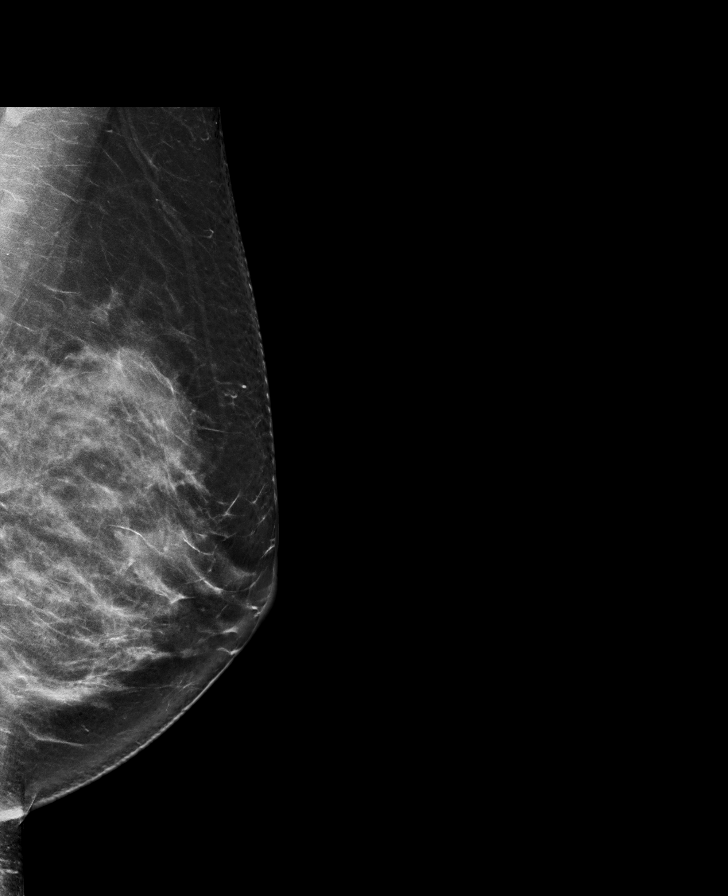

[L CC synth-2D]
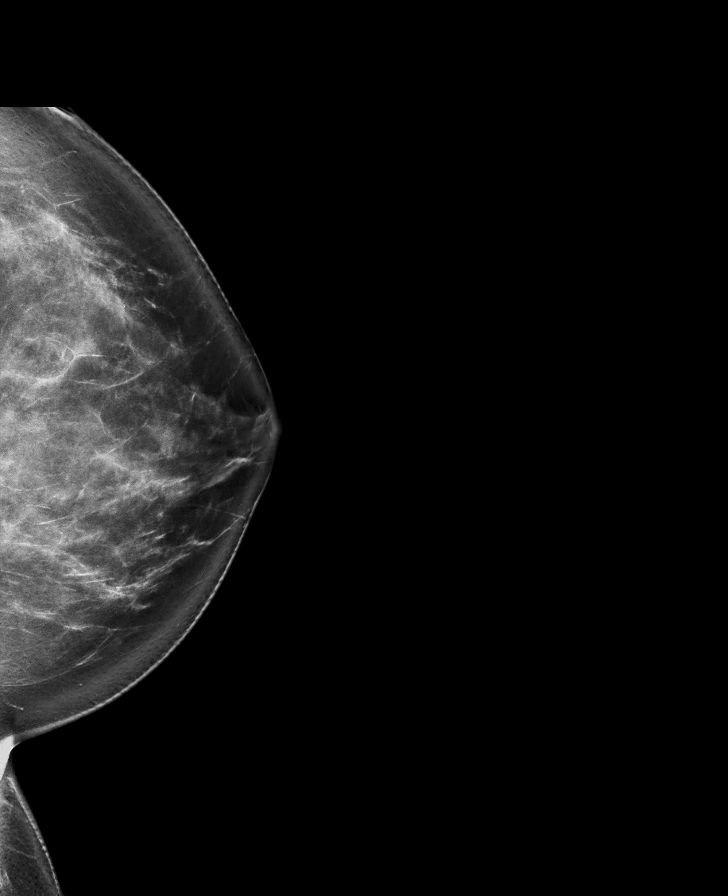

[R MLO synth-2D]
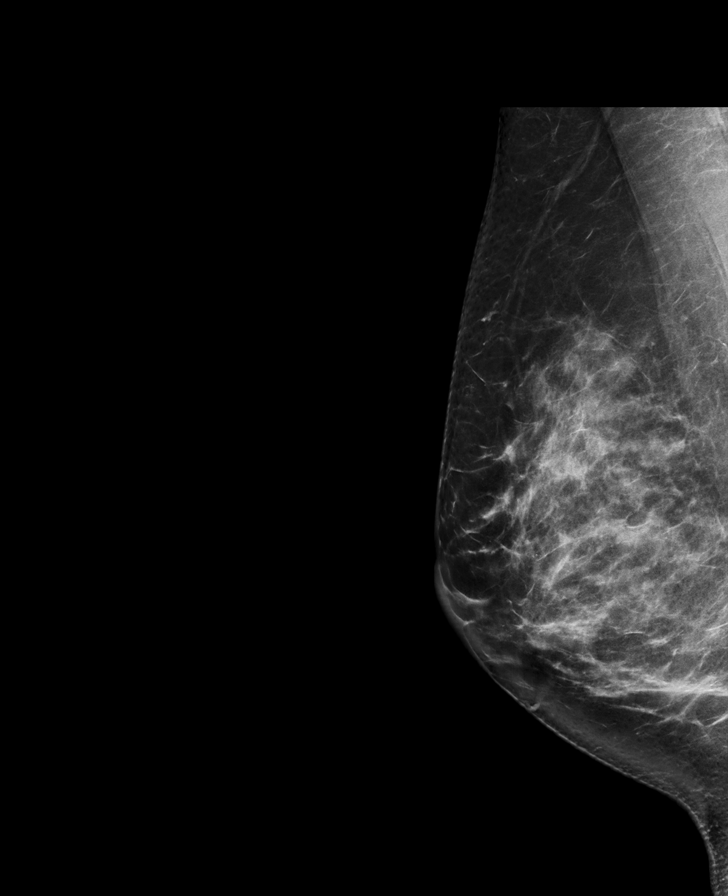

[R CC synth-2D]
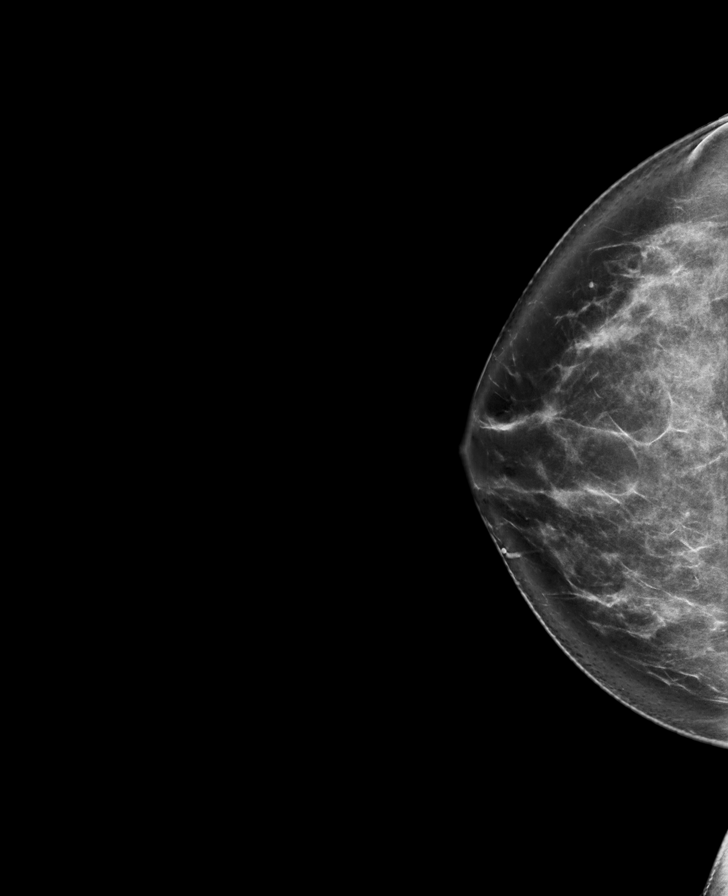

[L CC tomo · tomo slice 52/103.0]
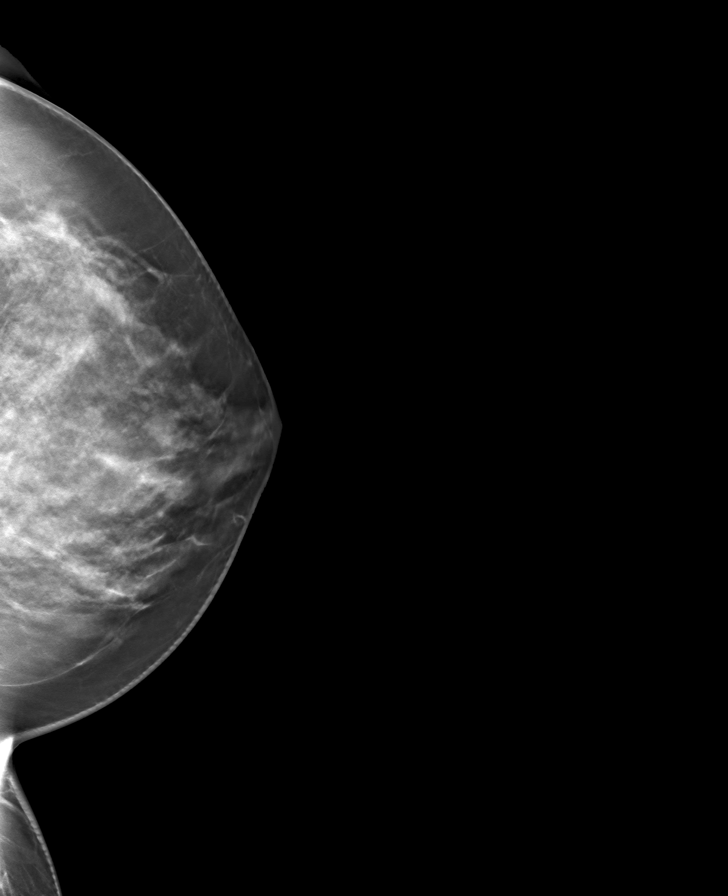

[L MLO tomo · tomo slice 48/95.0]
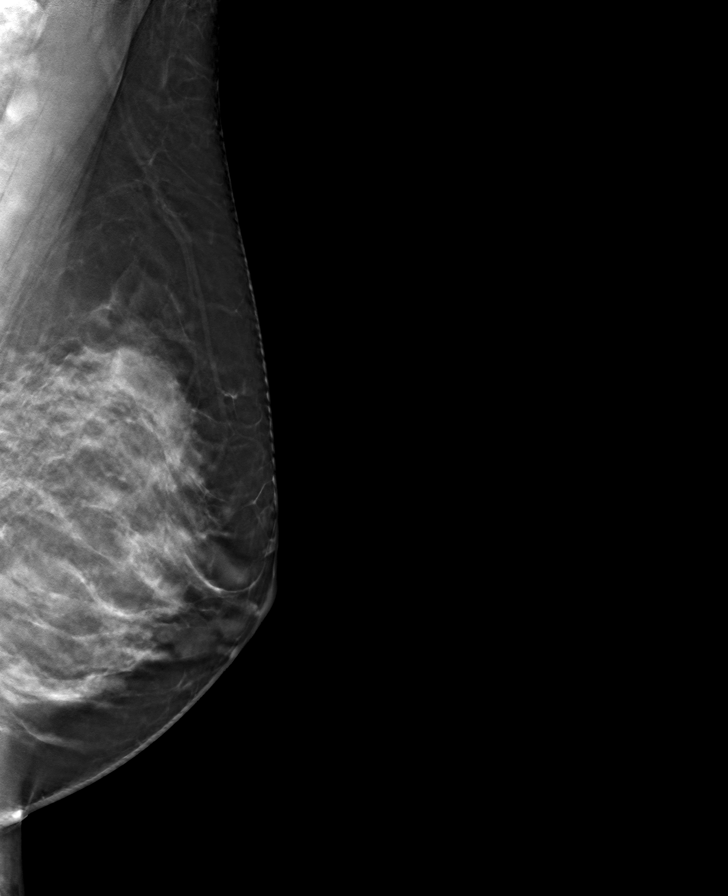

[R CC tomo · tomo slice 49/96.0]
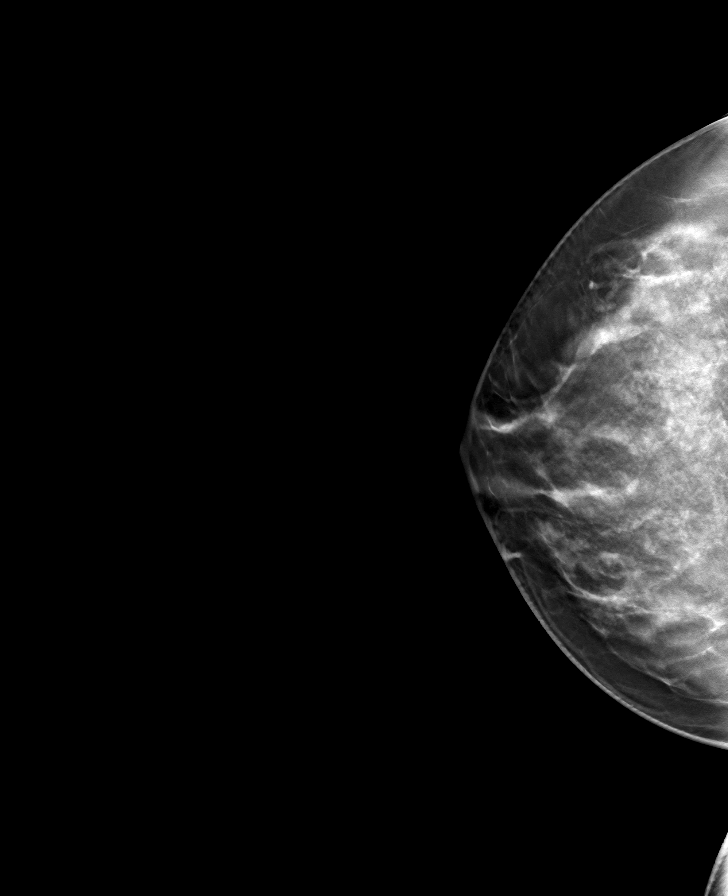

[R MLO tomo · tomo slice 47/93.0]
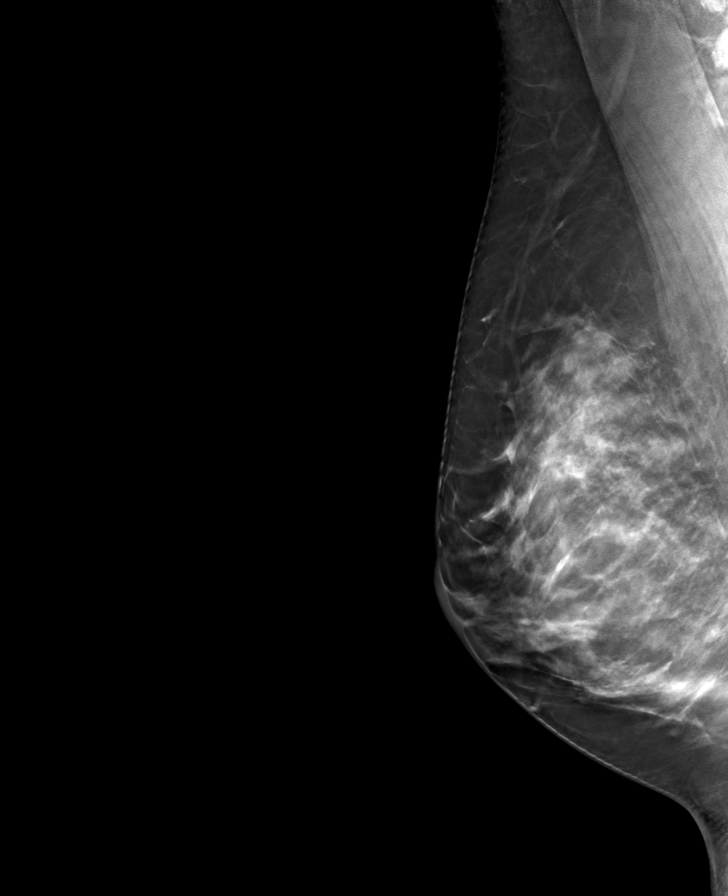

[8 of 24 positions shown; findings below may reference images not displayed]

ACR Breast Density Category c: The breast tissue is heterogeneously
dense, which may obscure small masses.
FINDINGS: There are no findings suspicious for malignancy.
IMPRESSION: No mammographic evidence of malignancy. A result letter of this
screening mammogram will be mailed directly to the patient.

RECOMMENDATION:
Screening mammogram in one year. (Code:Q3-W-BC3)

BI-RADS CATEGORY  1: Negative.

## 2022-07-03 ENCOUNTER — Telehealth: Payer: Self-pay

## 2022-07-03 NOTE — Telephone Encounter (Signed)
Lvmtcb

## 2022-07-03 NOTE — Telephone Encounter (Signed)
She will need an office visit for Rx. She can do an E-visit through [facility] which may be more affordable.  I am still happy to see her.

## 2022-07-03 NOTE — Telephone Encounter (Signed)
Patient states she was working in yard 5 days ago. Started having break out yesterday on both arms from glove line up. She states she would like to have cream called in. Advised patient that visit was requested patient declined. States last visit was over 100 dollars and she will not be seen in office. Wanted to just have Anda Kraft call something in.  Denies any swelling or sob. Has used 2.5 hydrocortisone cream in the past and was helpful.  Has used over the counter Oak scrub and 1% hydrocortisone cream. Her clothing is irritating it

## 2022-12-18 ENCOUNTER — Ambulatory Visit (INDEPENDENT_AMBULATORY_CARE_PROVIDER_SITE_OTHER): Payer: No Typology Code available for payment source | Admitting: Primary Care

## 2022-12-18 ENCOUNTER — Encounter: Payer: Self-pay | Admitting: *Deleted

## 2022-12-18 ENCOUNTER — Encounter: Payer: Self-pay | Admitting: Primary Care

## 2022-12-18 VITALS — BP 132/82 | HR 117 | Temp 97.9°F | Ht 68.0 in | Wt 193.0 lb

## 2022-12-18 DIAGNOSIS — M2550 Pain in unspecified joint: Secondary | ICD-10-CM

## 2022-12-18 DIAGNOSIS — Z0000 Encounter for general adult medical examination without abnormal findings: Secondary | ICD-10-CM

## 2022-12-18 DIAGNOSIS — E663 Overweight: Secondary | ICD-10-CM

## 2022-12-18 DIAGNOSIS — F419 Anxiety disorder, unspecified: Secondary | ICD-10-CM

## 2022-12-18 DIAGNOSIS — G8929 Other chronic pain: Secondary | ICD-10-CM

## 2022-12-18 DIAGNOSIS — Z803 Family history of malignant neoplasm of breast: Secondary | ICD-10-CM

## 2022-12-18 DIAGNOSIS — Z0001 Encounter for general adult medical examination with abnormal findings: Secondary | ICD-10-CM

## 2022-12-18 DIAGNOSIS — Z1231 Encounter for screening mammogram for malignant neoplasm of breast: Secondary | ICD-10-CM | POA: Diagnosis not present

## 2022-12-18 LAB — LIPID PANEL
Cholesterol: 164 mg/dL (ref 0–200)
HDL: 48.4 mg/dL (ref >39.00)
LDL Cholesterol: 101 mg/dL — ABNORMAL HIGH (ref 0–99)
NonHDL: 116.06
Total CHOL/HDL Ratio: 3
Triglycerides: 75 mg/dL (ref 0.0–149.0)
VLDL: 15 mg/dL (ref 0.0–40.0)

## 2022-12-18 LAB — COMPREHENSIVE METABOLIC PANEL
ALT: 10 U/L (ref 0–35)
AST: 11 U/L (ref 0–37)
Albumin: 4.1 g/dL (ref 3.5–5.2)
Alkaline Phosphatase: 65 U/L (ref 39–117)
BUN: 9 mg/dL (ref 6–23)
CO2: 27 meq/L (ref 19–32)
Calcium: 8.9 mg/dL (ref 8.4–10.5)
Chloride: 106 meq/L (ref 96–112)
Creatinine, Ser: 0.75 mg/dL (ref 0.40–1.20)
GFR: 99.6 mL/min (ref >60.00)
Glucose, Bld: 98 mg/dL (ref 70–99)
Potassium: 4.8 meq/L (ref 3.5–5.1)
Sodium: 140 meq/L (ref 135–145)
Total Bilirubin: 0.5 mg/dL (ref 0.2–1.2)
Total Protein: 6.7 g/dL (ref 6.0–8.3)

## 2022-12-18 LAB — CBC
HCT: 42.1 % (ref 36.0–46.0)
Hemoglobin: 13.8 g/dL (ref 12.0–15.0)
MCHC: 32.8 g/dL (ref 30.0–36.0)
MCV: 85.6 fl (ref 78.0–100.0)
Platelets: 291 10*3/uL (ref 150.0–400.0)
RBC: 4.91 Mil/uL (ref 3.87–5.11)
RDW: 13.1 % (ref 11.5–15.5)
WBC: 6.1 10*3/uL (ref 4.0–10.5)

## 2022-12-18 LAB — TSH: TSH: 2.54 u[IU]/mL (ref 0.35–5.50)

## 2022-12-18 NOTE — Progress Notes (Signed)
Subjective:    Patient ID: Debbie Farmer, female    DOB: [birth year], 42 y.o.   MRN: [id number]  HPI  Debbie Farmer is a very pleasant 42 y.o. female who presents today for complete physical and follow up of chronic conditions.  She would like to discuss several concerns.  She has a mole to her left lateral lower extremity which has been chronic for >1 year. She nicks her mole when she shaves which will scab over. The picture of the mole has been scaly because of this.  She's concerned about her weight gain. She's gained 30 pounds since 2021 despite regular activity. Over the last 1 year she's increased activity level by hiking, walking her dog, jumping on a trampoline. She weighed 161 pounds in 2021 after strict calorie counting and dieting, exercise.  Diet currently consists of:  Breakfast: Cereal with fruit Lunch: Peppers, egg, sausage wrap Dinner: Frozen meals, salad, once weekly fast food Snacks: Yogurt, string cheese Desserts: Infrequently  Beverages: Coffee, water   She's also noticed a warmth feeling all over her body, redness to her face. Her IUD was placed one year ago. She does not have a menstrual cycle, no spotting. She questions is she is going through perimenopause. She doesn't recall the age of her mother when going through menopause.   She continues to experience joint pain to her hands. Chronic to the right 5th digit for >1 year. Over the last year she's noticed pain to the joints to her left 2-5 digits. Her joint pain is so bad that she's had to alter the way she eats, cooks, brush her teeth, does activities of daily living.  Even petting her dog is painful.  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 Last year she completed labs for RA which were negative. She's tried Voltaren Gel, Tylenol, Ibuprofen without much improvement. She has not seen rheumatology.   Immunizations: -Tetanus: Completed in 2021 -Influenza: Declines  influenza vaccine.  Diet: Fair diet.  Exercise: No regular exercise.  Eye exam: Completed several years ago.  Dental exam: Completes semi-annually    Pap Smear: Completed in February 2023 Mammogram: Completed in April 2023  BP Readings from Last 3 Encounters:  12/18/22 132/82  05/08/22 134/86  01/21/22 138/88         Review of Systems  Constitutional:  Negative for unexpected weight change.  HENT:  Negative for rhinorrhea.   Respiratory:  Negative for cough and shortness of breath.   Cardiovascular:  Negative for chest pain.  Gastrointestinal:  Negative for constipation and diarrhea.  Genitourinary:  Negative for difficulty urinating and menstrual problem.  Musculoskeletal:  Positive for arthralgias. Negative for myalgias.  Skin:  Negative for rash.  Allergic/Immunologic: Negative for environmental allergies.  Neurological:  Negative for dizziness and headaches.  Psychiatric/Behavioral:  The patient is not nervous/anxious.          Past Medical History:  Diagnosis Date   Acid reflux 2019   Acute non-recurrent maxillary sinusitis 05/08/2022   Allergic rhinitis    Allergy    Anxiety    Chlamydia 06/2016   Family history of breast cancer 06/08/2013   Lifetime risk 31.6%   History of mammogram 05/09/2014   birad 2;   History of Papanicolaou smear of cervix 04/27/2013   -/-;   Increased risk of breast cancer 2015   IBIS=31%   Right upper quadrant abdominal pain 04/16/2017   Urinary tract infection     Social  History   Socioeconomic History   Marital status: Married    Spouse name: Not on file   Number of children: Not on file   Years of education: Not on file   Highest education level: Master's degree (e.g., MA, MS, MEng, MEd, MSW, MBA)  Occupational History   Not on file  Tobacco Use   Smoking status: Never   Smokeless tobacco: Never  Vaping Use   Vaping status: Never Used  Substance and Sexual Activity   Alcohol use: Yes    Comment: rarely    Drug use: Never   Sexual activity: Yes    Birth control/protection: Abstinence    Comment: Kyleena   Other Topics Concern   Not on file  Social History Narrative   Married.   Works as Geneticist, molecular.   Enjoys playing cello.   Social Determinants of Health   Financial Resource Strain: Medium Risk (12/16/2022)   Overall Financial Resource Strain (CARDIA)    Difficulty of Paying Living Expenses: Somewhat hard  Food Insecurity: No Food Insecurity (12/16/2022)   Hunger Vital Sign    Worried About Running Out of Food in the Last Year: Never true    Ran Out of Food in the Last Year: Never true  Transportation Needs: No Transportation Needs (12/16/2022)   PRAPARE - Administrator, Civil Service (Medical): No    Lack of Transportation (Non-Medical): No  Physical Activity: Insufficiently Active (12/16/2022)   Exercise Vital Sign    Days of Exercise per Week: 3 days    Minutes of Exercise per Session: 30 min  Stress: Stress Concern Present (12/16/2022)   [employer] of Occupational Health - Occupational Stress Questionnaire    Feeling of Stress : To some extent  Social Connections: Not on file  Intimate Partner Violence: Not At Risk (05/10/2021)   Humiliation, Afraid, Rape, and Kick questionnaire    Fear of Current or Ex-Partner: No    Emotionally Abused: No    Physically Abused: No    Sexually Abused: No    Past Surgical History:  Procedure Laterality Date   WISDOM TOOTH EXTRACTION     WISDOM TOOTH EXTRACTION      Family History  Problem Relation Age of Onset   Breast cancer Mother 31       ER-; no genetic testing done   Arthritis Mother    Miscarriages / [place] Mother    Basal cell carcinoma Father 24   Hypertension Father    Diabetes Maternal Grandmother 48   Heart disease Maternal Grandmother    Prostate cancer Maternal Grandfather    Heart disease Maternal Grandfather    Hypertension Maternal Grandfather    Diabetes Paternal Grandmother 31   Heart  disease Paternal Grandfather    Hypertension Paternal Grandfather     Allergies  Allergen Reactions   Corn-Containing Products    Ginger    Mustard [Allyl Isothiocyanate]    Sesame Seed (Diagnostic)    Penicillins Rash    Current Outpatient Medications on File Prior to Visit  Medication Sig Dispense Refill   famotidine (PEPCID) 20 MG tablet TAKE 1 TABLET BY MOUTH TWICE A DAY 60 tablet 5   fluticasone (FLONASE) 50 MCG/ACT nasal spray PLACE 1-2 SPRAYS IN EACH NOSTRIL ONCE A DAY  6   levocetirizine (XYZAL) 5 MG tablet Take 5 mg by mouth every evening.     levonorgestrel (MIRENA) 20 MCG/DAY IUD 1 each by Intrauterine route once.     No current facility-administered medications  on file prior to visit.    BP 132/82   Pulse (!) 117   Temp 97.9 F (36.6 C) (Temporal)   Ht 5\' 8"  (1.727 m)   Wt 193 lb (87.5 kg)   SpO2 99%   BMI 29.35 kg/m  Objective:   Physical Exam HENT:     Right Ear: Tympanic membrane and ear canal normal.     Left Ear: Tympanic membrane and ear canal normal.     Nose: Nose normal.  Eyes:     Conjunctiva/sclera: Conjunctivae normal.     Pupils: Pupils are equal, round, and reactive to light.  Neck:     Thyroid: No thyromegaly.  Cardiovascular:     Rate and Rhythm: Normal rate and regular rhythm.     Heart sounds: No murmur heard. Pulmonary:     Effort: Pulmonary effort is normal.     Breath sounds: Normal breath sounds. No rales.  Abdominal:     General: Bowel sounds are normal.     Palpations: Abdomen is soft.     Tenderness: There is no abdominal tenderness.  Musculoskeletal:        General: Normal range of motion.     Cervical back: Neck supple.  Lymphadenopathy:     Cervical: No cervical adenopathy.  Skin:    General: Skin is warm and dry.     Findings: No rash.     Comments: 1 cm rounded, raised, light brown nevus to left lateral lower extremity distal to calf  Neurological:     Mental Status: She is alert and oriented to person, place,  and time.     Cranial Nerves: No cranial nerve deficit.     Deep Tendon Reflexes: Reflexes are normal and symmetric.  Psychiatric:     Comments: Tearful during exam           Assessment & Plan:  Encounter for annual general medical examination with abnormal findings in adult Assessment & Plan: Immunizations UTD. Declines influenza vaccine. Pap smear UTD. Mammogram due, orders placed.   Discussed the importance of a healthy diet and regular exercise in order for weight loss, and to reduce the risk of further co-morbidity.  Exam stable. Labs pending.  Follow up in 1 year for repeat physical.   Orders: -     Lipid panel -     Comprehensive metabolic panel -     CBC  Family history of breast cancer Assessment & Plan: Mammogram due and ordered.   Orders: -     3D Screening Mammogram, Left and Right; Future  Chronic joint pain Assessment & Plan: Deteriorated to the point where this has impacted her ADLs.  Negative RA earlier this year. Referral placed to rheumatology.   Orders: -     Ambulatory referral to Rheumatology  Screening mammogram for breast cancer -     3D Screening Mammogram, Left and Right; Future  Anxiety Assessment & Plan: Increased recently because of chronic joint symptoms progressing. Continue to monitor.   Overweight (BMI 25.0-29.9) Assessment & Plan: Less likely perimenopause, especially given her age.  Long discussion today about her diet and regular exercise routine. Recommended she think about any changes she could make in her day-to-day routine.  She is quite ravenous when she comes home from work so we discussed snacks or a heavier protein-based lunch.  Continue regular exercise. Reviewed TSH from October 2023 which was normal.  Orders: -     Lipid panel -     Comprehensive metabolic  panel -     CBC -     TSH        Doreene Nest, NP

## 2022-12-18 NOTE — Assessment & Plan Note (Signed)
Increased recently because of chronic joint symptoms progressing. Continue to monitor.

## 2022-12-18 NOTE — Patient Instructions (Signed)
[facility] to schedule your mamm[facility].   You will either be contacted via phone regarding your referral to rheumatology, or you may receive a letter on your MyChart portal from our referral team with instructions for scheduling an appointment. Please let us know if you have not been contacted by anyone within two weeks.  Stop by the lab prior to leaving today. I will notify you of your results once received.   It was a pleasure to see you today!

## 2022-12-18 NOTE — Assessment & Plan Note (Addendum)
Less likely perimenopause, especially given her age.  Long discussion today about her diet and regular exercise routine. Recommended she think about any changes she could make in her day-to-day routine.  She is quite ravenous when she comes home from work so we discussed snacks or a heavier protein-based lunch.  Continue regular exercise. Reviewed TSH from October 2023 which was normal.

## 2022-12-18 NOTE — Assessment & Plan Note (Signed)
Mammogram due and ordered

## 2022-12-18 NOTE — Assessment & Plan Note (Signed)
Deteriorated to the point where this has impacted her ADLs.  Negative RA earlier this year. Referral placed to rheumatology.

## 2022-12-18 NOTE — Assessment & Plan Note (Signed)
Immunizations UTD. Declines influenza vaccine.  Pap smear UTD. Mammogram due, orders placed.  Discussed the importance of a healthy diet and regular exercise in order for weight loss, and to reduce the risk of further co-morbidity.  Exam stable. Labs pending.  Follow up in 1 year for repeat physical.

## 2023-01-01 ENCOUNTER — Ambulatory Visit
Admission: RE | Admit: 2023-01-01 | Discharge: 2023-01-01 | Disposition: A | Discharge status: Home / Self Care | Payer: No Typology Code available for payment source | Source: Ambulatory Visit | Attending: Primary Care | Admitting: Primary Care

## 2023-01-01 DIAGNOSIS — Z1231 Encounter for screening mammogram for malignant neoplasm of breast: Secondary | ICD-10-CM | POA: Insufficient documentation

## 2023-01-01 DIAGNOSIS — Z803 Family history of malignant neoplasm of breast: Secondary | ICD-10-CM | POA: Diagnosis present

## 2023-02-02 ENCOUNTER — Ambulatory Visit: Payer: No Typology Code available for payment source | Attending: Rheumatology | Admitting: Occupational Therapy

## 2023-02-02 ENCOUNTER — Encounter: Payer: Self-pay | Admitting: Occupational Therapy

## 2023-02-02 DIAGNOSIS — M25642 Stiffness of left hand, not elsewhere classified: Secondary | ICD-10-CM | POA: Insufficient documentation

## 2023-02-02 DIAGNOSIS — M79641 Pain in right hand: Secondary | ICD-10-CM | POA: Diagnosis present

## 2023-02-02 DIAGNOSIS — M25641 Stiffness of right hand, not elsewhere classified: Secondary | ICD-10-CM | POA: Diagnosis present

## 2023-02-02 DIAGNOSIS — M79642 Pain in left hand: Secondary | ICD-10-CM | POA: Insufficient documentation

## 2023-02-02 NOTE — Therapy (Signed)
OUTPATIENT OCCUPATIONAL THERAPY ORTHO EVALUATION  Patient Name: Debbie Farmer MRN: [id number] DOB:05-Apr-1981, 42 y.o., female Today's Date: 02/02/2023  PCP: Chestine Spore NP REFERRING PROVIDER: Defoor PA   END OF SESSION:  OT End of Session - 02/02/23 1258     Visit Number 1    Number of Visits 6    Date for OT Re-Evaluation 03/16/23    OT Start Time 1033    OT Stop Time 1138    OT Time Calculation (min) 65 min    Activity Tolerance Patient tolerated treatment well    Behavior During Therapy [facility] for tasks assessed/performed             Past Medical History:  Diagnosis Date   Acid reflux 2019   Acute non-recurrent maxillary sinusitis 05/08/2022   Allergic rhinitis    Allergy    Anxiety    Chlamydia 06/2016   Family history of breast cancer 06/08/2013   Lifetime risk 31.6%   History of mammogram 05/09/2014   birad 2;   History of Papanicolaou smear of cervix 04/27/2013   -/-;   Increased risk of breast cancer 2015   IBIS=31%   Right upper quadrant abdominal pain 04/16/2017   Urinary tract infection    Past Surgical History:  Procedure Laterality Date   WISDOM TOOTH EXTRACTION     WISDOM TOOTH EXTRACTION     Patient Active Problem List   Diagnosis Date Noted   Chronic joint pain 01/21/2022   Overweight (BMI 25.0-29.9) 05/22/2021   Seborrheic dermatitis 04/28/2019   Encounter for annual general medical examination with abnormal findings in adult 04/28/2019   Anxiety 04/16/2017   Increased risk of breast cancer 06/26/2016   Allergic rhinitis    Family history of breast cancer 06/08/2013    ONSET DATE: 01/22/23  REFERRING DIAG: Bilateral OA of hands   THERAPY DIAG:  Stiffness of left hand, not elsewhere classified  Stiffness of right hand, not elsewhere classified  Pain in both hands  Rationale for Evaluation and Treatment: Rehabilitation  SUBJECTIVE:   SUBJECTIVE STATEMENT: 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  Try play about 30 minutes at a time with each student.  For enjoyment I play piano and also have dogs as well as work in the yard little bit.  My right pinky bothered me a year or 2 ago.  And left pinky clicks at times.  My hands just really stiff in the morning and then I have some increase soreness maybe pain in some fingers by the end of the day if I used them a lot. Pt accompanied by: self  PERTINENT HISTORY: 01/22/23 Rheumatology visit:  Debbie Farmer is a 42 y.o.female who presents today for evaluation of joint pain.   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 She has had to cut back on recreational piano playing due to her pains in her hands.   Today She reports joint pains in her hands that onset 2019 in her DIPs of her small digits. Her hand pains began markedly worsening in February 2024 with a right 5th DIP getting "  red, hot, puffy, and painful." She notes she has been advised to try topical treatments for her hands, but that does not work with her teaching or her dog ownership. She notes a bit of discomfort at the base of her thumbs. She notes that this is worsened by rest and improved by activity. She reports some morning stiffness but is unsure of duration, noting currently her morning stiffness does not change the timeframe of her early morning routine.   Assessment and Plan: Primary osteoarthritis of both hands (primary encounter diagnosis) Plan: X-ray hand right minimum 3 views, X-ray hand  left minimum 3 views, meloxicam (MOBIC) 15 MG  tablet, Ambulatory Referral to Occupational   Therapy  History and physical are persuasive for hand osteoarthritis with additional treatment considerations due to her professions. We will have her increase voltaren gel use to once nightly, work with Marisue Humble with hand therapy to maximize hand function and minimize pain, and issue a prn meloxicam prescription for sparing use. We will plan to reevaluate her symptoms in a few months. Currently no suggestion of inflammatory arthritis.    PRECAUTIONS: None     WEIGHT BEARING RESTRICTIONS: No  PAIN:  Are you having pain? 2/10 at the most  FALLS: Has patient fallen in last 6 months? No  LIVING ENVIRONMENT: Lives with: lives with their spouse  PLOF:   PATIENT GOALS: I want to know what to do about the stiffness/pain- to maintain my motion and strength so that I can use my hands as long as I can in my occupations.  NEXT MD VISIT: 6 months  OBJECTIVE:  Note: Objective measures were completed at Evaluation unless otherwise noted.  HAND DOMINANCE: Right       UPPER EXTREMITY ROM:     Active ROM Right eval Left eval  Shoulder flexion    Shoulder abduction    Shoulder adduction    Shoulder extension    Shoulder internal rotation    Shoulder external rotation    Elbow flexion    Elbow extension    Wrist flexion    Wrist extension 55 64  Wrist ulnar deviation 90 75  Wrist radial deviation    Wrist pronation    Wrist supination    (Blank rows = not tested)  Active ROM Right eval Left eval  Thumb MCP (0-60)    Thumb IP (0-80)    Thumb Radial abd/add (0-55) 55 45   Thumb Palmar abd/add (0-45) 64  65  Thumb Opposition to Small Finger Opposition to 5th -decrease MC flexion   Opposition 5th   Index MCP (0-90) 75 80   Index PIP (0-100) 100 100   Index DIP (0-70)      Long MCP (0-90)  80 85   Long PIP (0-100)  100  100  Long DIP (0-70)      Ring MCP (0-90) 90  90   Ring PIP (0-100) 95   95  Ring DIP (0-70)      Little MCP (0-90) 90   90  Little PIP (0-100)  95   95  Little DIP (0-70)      (Blank rows = not tested)   UPPER EX strength -  R wrist ext - radial deviating   HAND FUNCTION: Grip strength: Right: 70 lbs; Left: 65 lbs, Lateral pinch: Right: 17 lbs, Left: 19 lbs, and 3 point pinch: Right: 13 lbs, Left: 16 lbs Pain in palm with grip - lat pinch R pain thumb CMC  COORDINATION: [facility]  SENSATION: WFL  EDEMA: none notice  COGNITION: Overall cognitive status: Within functional limits for tasks assessed   OBSERVATIONS:    TODAY'S TREATMENT:                                                                                                                              DATE: 02/02/23 Paraffin down to bilateral hands 5 to 10 minutes prior to soft tissue mobs and review of active range of motion. Patient show increased wrist extension as well as thumb radial abduction after paraffin and soft tissue. Patient educated and husband to assist with soft tissue lopes to webspace as well as metacarpal and carpal spreads.  1 or 2 times a day Moist heat to be done mornings and evenings. Prayer stretch with a gentle stretch for flexors Active assisted range of motion for radial ulnar deviation 12 reps Followed by gentle tendon glides as well as palmar radial abduction 12 reps Opposition focusing on oval to all digits All pain-free Reviewed with patient handout on joint protection to modify and adapt and activities. 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  As well as a list with activities that bothers her hands.  PATIENT EDUCATION: Education details: findings of eval and HEP  Person educated: Patient Education method: Explanation, Demonstration, Tactile cues, Verbal cues, and Handouts Education comprehension: verbalized understanding, returned demonstration, verbal cues required, and needs further education   LONG TERM GOALS:  REVIEWED WITH PT  Target date: 6 wks   Patient to be independent in a  HEP to increase thumb and wrist active range of motion in all planes to push door,  turn doorknob, do bathing and dressing symptoms free. Baseline: Thumb  radial abduction 45 to 55 with stiffness , decrease MC flexion in R in opposition to fifth , wrist ext decrease bilateral 55-64 degrees Goal status: INITIAL   2.  Bilateral digits flexion and extention increase to [facility] pain free for pt to hold cylinder objects and play instrument with less discomfort  Baseline: Stiffness with digits composite flexion and digits extention - stiffness worse in R 5th and L 2nd and 5th - clicking in L 5th MC - avoid end ranges because of stiffness and discomfort  Goal status: INITIAL   3.  Patient to verbalize and demo on straight 3 modifications or joint protection to increased ease on bilateral hands with ADLs and IADLs, 3 adaptive equipments to increase ease and discomfort using hands  baseline: Patient no knowledge about modifications or joint protection for bilateral hands or upper extremity.  Goal status: INITIAL   4.  Grip and prehension strength increased to within normal range for her age without increased symptoms Baseline: Right grip 70 pounds and left 65 pounds, lateral pinch right 17 pounds  pain and left 19 pounds and 3-point pinch right 13 pound and left 16 pounds.  Patient had pain with gripping and palm  and has thumb CMC less than a 2/10. Goal status: INITIAL  ASSESSMENT:  CLINICAL IMPRESSION: Patient seen today for occupational therapy evaluation for osteoarthritis in bilateral hands.  Patient with tenderness at bilateral thumb CMC as well as bilateral fifth digit PIP DIP and left second digit PIP DIP.  Patient with decreased bilateral wrist extension, thumb radial abduction as well as composite extension and flexion increased stiffness.  Patient with increased discomfort with grip and prehension strength in palm and thumb CMC.  Patient report increased discomfort and pain at the end of the day if  using her hands a lot.  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  To play piano on her own, as well as having dogs and doing yard work.  Patient responded good to paraffin and moist heat prior to review of home exercises and soft tissue mobilization.  Patient can benefit from skilled OT services to increase motion decrease pain and increase strength, ed on modifications and joint protection to maintain her functional use of bilateral hands and ADLs and IADLs.  PERFORMANCE DEFICITS: in functional skills including ADLs, IADLs, ROM, strength, pain, flexibility, decreased knowledge of use of DME, and UE functional use,   and psychosocial skills including environmental adaptation and routines and behaviors.   IMPAIRMENTS: are limiting patient from ADLs, IADLs, rest and sleep, play, leisure, and social participation.   COMORBIDITIES: has no other co-morbidities that affects occupational performance. Patient will benefit from skilled OT to address above impairments and improve overall function.  MODIFICATION OR ASSISTANCE TO COMPLETE EVALUATION: No modification of tasks or assist necessary to complete an evaluation.  OT OCCUPATIONAL PROFILE AND HISTORY: Problem focused assessment: Including review of records relating to presenting problem.  CLINICAL DECISION MAKING: LOW - limited treatment options, no task modification necessary  REHAB POTENTIAL: Good for goals  EVALUATION COMPLEXITY: Low   PLAN:  OT FREQUENCY: 1-2 x/week depending on progress  OT DURATION: 6 weeks PLANNING:  Splinting;Paraffin;Fluidtherapy;Contrast Bath;DME and/or AE instruction;Manual Therapy;Passive range of motion;Therapeutic activities, Ultrasound,  There ex, pt education    Oletta Cohn, OTR/L,CLT 02/02/2023, 1:00 PM

## 2023-02-04 ENCOUNTER — Telehealth: Payer: Self-pay | Admitting: Primary Care

## 2023-02-04 NOTE — Telephone Encounter (Signed)
Pt called stating she was reviewing over her med history on her mychart & saw how it mentioned how she tested pos for chlamydia in 2018. Pt asked if that test result could be removed off her mychart due to the testing being from years ago? Pt explained how she was upset how it appears still on her chart & believes it should not be shown. Call back # [phone]

## 2023-02-05 NOTE — Telephone Encounter (Signed)
Looks like patient had this tested at her OB/GYN office, not here.   I do not believe this can be removed from her medical record, definitely not from our end. She could try calling medical records to see if this can be removed.

## 2023-02-05 NOTE — Telephone Encounter (Signed)
Called patient and reviewed all information. Patient verbalized understanding. Will call if any further questions.  

## 2023-02-13 ENCOUNTER — Ambulatory Visit: Payer: No Typology Code available for payment source | Attending: Rheumatology | Admitting: Occupational Therapy

## 2023-02-13 DIAGNOSIS — M79642 Pain in left hand: Secondary | ICD-10-CM | POA: Insufficient documentation

## 2023-02-13 DIAGNOSIS — M25642 Stiffness of left hand, not elsewhere classified: Secondary | ICD-10-CM | POA: Insufficient documentation

## 2023-02-13 DIAGNOSIS — M79641 Pain in right hand: Secondary | ICD-10-CM | POA: Insufficient documentation

## 2023-02-13 DIAGNOSIS — M25641 Stiffness of right hand, not elsewhere classified: Secondary | ICD-10-CM | POA: Insufficient documentation

## 2023-02-13 NOTE — Therapy (Signed)
OUTPATIENT OCCUPATIONAL THERAPY ORTHO TREATMENT  Patient Name: Debbie Farmer MRN: [id number] DOB:[birth year], 42 y.o., female Today's Date: 02/13/2023  PCP: Chestine Spore NP REFERRING PROVIDER: Defoor PA   END OF SESSION:  OT End of Session - 02/13/23 1011     Visit Number 2    Number of Visits 6    Date for OT Re-Evaluation 03/16/23    OT Start Time 0902    OT Stop Time 0958    OT Time Calculation (min) 56 min    Activity Tolerance Patient tolerated treatment well    Behavior During Therapy [facility] for tasks assessed/performed             Past Medical History:  Diagnosis Date   Acid reflux 2019   Acute non-recurrent maxillary sinusitis 05/08/2022   Allergic rhinitis    Allergy    Anxiety    Chlamydia 06/2016   Family history of breast cancer 06/08/2013   Lifetime risk 31.6%   History of mammogram 05/09/2014   birad 2;   History of Papanicolaou smear of cervix 04/27/2013   -/-;   Increased risk of breast cancer 2015   IBIS=31%   Right upper quadrant abdominal pain 04/16/2017   Urinary tract infection    Past Surgical History:  Procedure Laterality Date   WISDOM TOOTH EXTRACTION     WISDOM TOOTH EXTRACTION     Patient Active Problem List   Diagnosis Date Noted   Chronic joint pain 01/21/2022   Overweight (BMI 25.0-29.9) 05/22/2021   Seborrheic dermatitis 04/28/2019   Encounter for annual general medical examination with abnormal findings in adult 04/28/2019   Anxiety 04/16/2017   Increased risk of breast cancer 06/26/2016   Allergic rhinitis    Family history of breast cancer 06/08/2013    ONSET DATE: 01/22/23  REFERRING DIAG: Bilateral OA of hands   THERAPY DIAG:  Stiffness of left hand, not elsewhere classified  Stiffness of right hand, not elsewhere classified  Pain in both hands  Rationale for Evaluation and Treatment: Rehabilitation  SUBJECTIVE:   SUBJECTIVE STATEMENT: 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 She has had to cut back on recreational piano playing due to her pains in her hands.   Today She reports joint pains in her hands that onset 2019 in her DIPs of her small digits. 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 She notes a bit of discomfort at the base of her thumbs. She notes that this is worsened by rest and improved by activity. She reports some morning stiffness but is unsure of duration, noting currently her morning stiffness  does not change the timeframe of her early morning routine.   Assessment and Plan: Primary osteoarthritis of both hands (primary encounter diagnosis) Plan: X-ray hand right minimum 3 views, X-ray hand  left minimum 3 views, meloxicam (MOBIC) 15 MG  tablet, Ambulatory Referral to Occupational  Therapy  History and physical are persuasive for hand osteoarthritis with additional treatment considerations due to her professions. We will have her increase voltaren gel use to once nightly, work with Marisue Humble with hand therapy to maximize hand function and minimize pain, and issue a prn meloxicam  prescription for sparing use. We will plan to reevaluate her symptoms in a few months. Currently no suggestion of inflammatory arthritis.    PRECAUTIONS: None     WEIGHT BEARING RESTRICTIONS: No  PAIN:  Are you having pain? 2/10 at the most  FALLS: Has patient fallen in last 6 months? No  LIVING ENVIRONMENT: Lives with: lives with their spouse  PLOF:   PATIENT GOALS: I want to know what to do about the stiffness/pain- to maintain my motion and strength so that I can use my hands as long as I can in my occupations.  NEXT MD VISIT: 6 months  OBJECTIVE:  Note: Objective measures were completed at Evaluation unless otherwise noted.  HAND DOMINANCE: Right       UPPER EXTREMITY ROM:     Active ROM Right eval Left eval R/L 02/13/23  Shoulder flexion     Shoulder abduction     Shoulder adduction     Shoulder extension     Shoulder internal rotation     Shoulder external rotation     Elbow flexion     Elbow extension     Wrist flexion   85/82  Wrist extension 55 64 40/70  Wrist ulnar deviation 90 75 35  Wrist radial deviation   30  Wrist pronation     Wrist supination     (Blank rows = not tested)  Active ROM Right eval Left eval R/L 02/13/23  Thumb MCP (0-60)     Thumb IP (0-80)     Thumb Radial abd/add (0-55) 55 45  52/45  Thumb Palmar abd/add (0-45) 64  65 72/42  Thumb Opposition to Small Finger Opposition to 5th -decrease MC flexion   Opposition 5th  Opposition to 5th - flexion 70-85 at IP , Less MC flexion on R  Index MCP (0-90) 75 80    Index PIP (0-100) 100 100    Index DIP (0-70)       Long MCP (0-90)  80 85    Long PIP (0-100)  100  100   Long DIP (0-70)       Ring MCP (0-90) 90  90    Ring PIP (0-100) 95   95   Ring DIP (0-70)       Little MCP (0-90) 90   90   Little PIP (0-100)  95  95   Little DIP (0-70)       (Blank rows = not tested)   UPPER EX strength -  R wrist ext - radial deviating   HAND FUNCTION: Grip strength: Right: 70  lbs; Left: 65 lbs, Lateral pinch: Right: 17 lbs, Left: 19 lbs, and 3 point pinch: Right: 13 lbs, Left: 16 lbs Pain in palm with grip - lat pinch R pain thumb CMC  COORDINATION: WFL  SENSATION: WFL  EDEMA: none notice  COGNITION: Overall cognitive status: Within functional limits for tasks assessed   OBSERVATIONS:  TODAY'S TREATMENT:                                                                                                                              DATE: 02/13/23 Reassessed patient wrist and thumb active range of motion, hand.  Patient continues to be limited in right wrist extension.  Left wrist extension within normal limits as well as flexion on bilateral wrist.  But strain past 70 degrees wrist ext on the L With some discomfort. Assess bilateral thumb palmar radial abduction.  Patient limited in radial abduction on right and palmar and radial abduction on the left. 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  Explaining decrease flexion Bilateral hands holds cords on cello and violin with a thumb palmar abduction as well as using bow with both thumbs in PA  Pt show decrease thumb PA and RA in L - and R decrease RA  Cont to be tender and tight in webspace   Plan to do fluidotherapy to bilateral hands - but pt is allergic to corn Done moist heat to bilateral hands and wrist - prior to soft tissue mobs and review of active range of motion. Patient show increased wrist extension as well as thumb  PA and RA after heat and soft tissue. Patient educated and husband to assist with soft tissue lopes to webspace as well as metacarpal and carpal spreads.  1 or 2 times a day IF not able to pt to do massage gentle over small ball for webspace, rolling over roll pin volar forearm  Prior to prayer stretch- review and change to decrease discomfort over thumb CMC Cont PA and RA of thumb   Try  some in between students All pain-free Put bow and instrument down when talking or teaching  Fitted with CMC neoprene thumb splint on L hand to use - assess if assist keeping thumb out of PA   Cont with joint protection and  modify and adapt activities. 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 Baseline: Thumb  radial abduction 45 to 55 with stiffness , decrease MC flexion in R in opposition to fifth , wrist ext decrease bilateral 55-64 degrees Goal status: INITIAL   2.  Bilateral digits flexion and extention increase to [facility] pain free for pt to hold cylinder objects and play instrument with less discomfort  Baseline: Stiffness with digits composite  flexion and digits extention - stiffness worse in R 5th and L 2nd and 5th - clicking in L 5th MC - avoid end ranges because of stiffness and discomfort  Goal status: INITIAL   3.  Patient to verbalize and demo on straight 3 modifications or joint protection to increased ease on bilateral hands with ADLs and IADLs, 3 adaptive equipments to increase ease and discomfort using hands  baseline: Patient no knowledge about modifications or joint protection for bilateral hands or upper extremity.  Goal status: INITIAL   4.  Grip and prehension strength increased to within normal range for her age without increased symptoms Baseline: Right grip 70 pounds and left 65 pounds, lateral pinch right 17 pounds  pain and  left 19 pounds and 3-point pinch right 13 pound and left 16 pounds.  Patient had pain with gripping and palm and has thumb CMC less than a 2/10. Goal status: INITIAL  ASSESSMENT:  CLINICAL IMPRESSION: Patient seen  for occupational therapy evaluation for osteoarthritis in bilateral hands.  Patient with tenderness at bilateral thumb CMC as well as bilateral fifth digit PIP DIP and left second digit PIP DIP.  Patient with decreased bilateral wrist extension, thumb radial abduction as well as composite extension and flexion increased stiffness.  Patient with increased discomfort with grip and prehension strength in palm and thumb CMC.  Patient report increased discomfort and pain at the end of the day if using her hands a lot.  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  To play piano on her own, as well as having dogs and doing yard work. 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  PERFORMANCE DEFICITS: in functional skills including ADLs, IADLs, ROM, strength, pain, flexibility, decreased knowledge of use of DME, and UE functional use,   and psychosocial skills including environmental adaptation and routines and behaviors.   IMPAIRMENTS: are limiting patient from ADLs, IADLs, rest and sleep, play, leisure, and social participation.   COMORBIDITIES: has no other co-morbidities that affects  occupational performance. Patient will benefit from skilled OT to address above impairments and improve overall function.  MODIFICATION OR ASSISTANCE TO COMPLETE EVALUATION: No modification of tasks or assist necessary to complete an evaluation.  OT OCCUPATIONAL PROFILE AND HISTORY: Problem focused assessment: Including review of records relating to presenting problem.  CLINICAL DECISION MAKING: LOW - limited treatment options, no task modification necessary  REHAB POTENTIAL: Good for goals  EVALUATION COMPLEXITY: Low   PLAN:  OT FREQUENCY: 1-2 x/week depending on progress  OT DURATION: 6 weeks PLANNING:  Splinting;Paraffin;Fluidtherapy;Contrast Bath;DME and/or AE instruction;Manual Therapy;Passive range of motion;Therapeutic activities, Ultrasound,  There ex, pt education    Oletta Cohn, OTR/L,CLT 02/13/2023, 10:14 AM

## 2023-02-16 ENCOUNTER — Ambulatory Visit: Payer: No Typology Code available for payment source | Admitting: Occupational Therapy

## 2023-02-16 DIAGNOSIS — M25641 Stiffness of right hand, not elsewhere classified: Secondary | ICD-10-CM

## 2023-02-16 DIAGNOSIS — M79641 Pain in right hand: Secondary | ICD-10-CM

## 2023-02-16 DIAGNOSIS — M25642 Stiffness of left hand, not elsewhere classified: Secondary | ICD-10-CM

## 2023-02-16 NOTE — Therapy (Signed)
OUTPATIENT OCCUPATIONAL THERAPY ORTHO TREATMENT  Patient Name: Debbie Farmer MRN: [id number] DOB:January 05, 1981, 42 y.o., female Today's Date: 02/16/2023  PCP: Chestine Spore NP REFERRING PROVIDER: Defoor PA   END OF SESSION:  OT End of Session - 02/16/23 1632     Visit Number 3    Number of Visits 6    Date for OT Re-Evaluation 03/16/23    OT Start Time 0903    OT Stop Time 0950    OT Time Calculation (min) 47 min    Activity Tolerance Patient tolerated treatment well    Behavior During Therapy [facility] for tasks assessed/performed             Past Medical History:  Diagnosis Date   Acid reflux 2019   Acute non-recurrent maxillary sinusitis 05/08/2022   Allergic rhinitis    Allergy    Anxiety    Chlamydia 06/2016   Family history of breast cancer 06/08/2013   Lifetime risk 31.6%   History of mammogram 05/09/2014   birad 2;   History of Papanicolaou smear of cervix 04/27/2013   -/-;   Increased risk of breast cancer 2015   IBIS=31%   Right upper quadrant abdominal pain 04/16/2017   Urinary tract infection    Past Surgical History:  Procedure Laterality Date   WISDOM TOOTH EXTRACTION     WISDOM TOOTH EXTRACTION     Patient Active Problem List   Diagnosis Date Noted   Chronic joint pain 01/21/2022   Overweight (BMI 25.0-29.9) 05/22/2021   Seborrheic dermatitis 04/28/2019   Encounter for annual general medical examination with abnormal findings in adult 04/28/2019   Anxiety 04/16/2017   Increased risk of breast cancer 06/26/2016   Allergic rhinitis    Family history of breast cancer 06/08/2013    ONSET DATE: 01/22/23  REFERRING DIAG: Bilateral OA of hands   THERAPY DIAG:  Stiffness of left hand, not elsewhere classified  Stiffness of right hand, not elsewhere classified  Pain in both hands  Rationale for Evaluation and Treatment: Rehabilitation  SUBJECTIVE:   SUBJECTIVE STATEMENT: Done better over the weekend - try and do the exercises and stretches in  shower- use my hands over the weekend - my 2 dogs when on hike, done some computer work and things around the house  Pt accompanied by: self  PERTINENT HISTORY: 01/22/23 Rheumatology visit:  Debbie Farmer is a 42 y.o.female who presents today for evaluation of joint pain.   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 She has had to cut back on recreational piano playing due to her pains in her hands.   Today She reports joint pains in her hands that onset 2019 in her DIPs of her small digits. 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 She notes a bit of discomfort at the base of her thumbs. She notes that this is worsened by rest and improved by activity. She reports some morning stiffness but is  unsure of duration, noting currently her morning stiffness does not change the timeframe of her early morning routine.   Assessment and Plan: Primary osteoarthritis of both hands (primary encounter diagnosis) Plan: X-ray hand right minimum 3 views, X-ray hand  left minimum 3 views, meloxicam (MOBIC) 15 MG  tablet, Ambulatory Referral to Occupational  Therapy  History and physical are persuasive for hand osteoarthritis with additional treatment considerations due to her professions. We will have her increase voltaren gel use to once nightly, work with Marisue Humble with hand therapy to maximize hand function and minimize  pain, and issue a prn meloxicam prescription for sparing use. We will plan to reevaluate her symptoms in a few months. Currently no suggestion of inflammatory arthritis.    PRECAUTIONS: None     WEIGHT BEARING RESTRICTIONS: No  PAIN:  Are you having pain? 2/10 at the most  FALLS: Has patient fallen in last 6 months? No  LIVING ENVIRONMENT: Lives with: lives with their spouse  PLOF:   PATIENT GOALS: I want to know what to do about the stiffness/pain- to maintain my motion and strength so that I can use my hands as long as I can in my occupations.  NEXT MD VISIT: 6 months  OBJECTIVE:  Note: Objective measures were completed at Evaluation unless otherwise noted.  HAND DOMINANCE: Right       UPPER EXTREMITY ROM:     Active ROM Right eval Left eval R/L 02/13/23 R/L 02/16/23  Shoulder flexion      Shoulder abduction      Shoulder adduction      Shoulder extension      Shoulder internal rotation      Shoulder external rotation      Elbow flexion      Elbow extension      Wrist flexion   85/82 90/82  Wrist extension 55 64 40/70 65/70  Wrist ulnar deviation 90 75 35   Wrist radial deviation   30   Wrist pronation      Wrist supination      (Blank rows = not tested)  Active ROM Right eval Left eval R/L 02/13/23 R/L 02/16/23  Thumb MCP (0-60)      Thumb IP (0-80)      Thumb Radial abd/add (0-55) 55 45  52/45 52/55  Thumb Palmar abd/add (0-45) 64  65 72/42 72/55  Thumb Opposition to Small Finger Opposition to 5th -decrease MC flexion   Opposition 5th  Opposition to 5th - flexion 70-85 at IP , Less MC flexion on R   Index MCP (0-90) 75 80   85/85  Index PIP (0-100) 100 100   100  Index DIP (0-70)        Long MCP (0-90)  80 85   85/85  Long PIP (0-100)  100  100  100  Long DIP (0-70)        Ring MCP (0-90) 90  90   90  Ring PIP (0-100) 95   95  100  Ring DIP (0-70)        Little MCP (0-90) 90   90  90/90  Little PIP (0-100)  95  95  95/90  Little DIP  (0-70)        (Blank rows = not tested)   UPPER EX strength -  R wrist ext - radial deviating   HAND FUNCTION: Grip strength: Right: 70 lbs; Left: 65 lbs, Lateral pinch: Right: 17 lbs, Left: 19 lbs, and 3 point pinch:  Right: 13 lbs, Left: 16 lbs Pain in palm with grip - lat pinch R pain thumb CMC   02/16/23 Grip strength: Right: 62 lbs; Left: 64 lbs, Lateral pinch: Right: 17 lbs, Left: 20 lbs, and 3 point pinch: Right: 14 lbs, Left: 15 lbs Less pain - pain with R thumb CMC lat pinch   COORDINATION: WFL  SENSATION: WFL  EDEMA: none notice  COGNITION: Overall cognitive status: Within functional limits for tasks assessed   OBSERVATIONS:    TODAY'S TREATMENT:                                                                                                                              DATE: 02/16/23 Patient done wrist extension stretches in the shower as well as thumb palmar radial abduction prior to coming in.  As well as some during the weekend.   See flowsheet-great progress in wrist extension as well as thumb palmar radial abduction. Less pain with grip and prehension strength. Patient within normal limits for grip and prehension for her age.  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   Did use her hands around the house as well as walking her 2 great Dane dogs on a leash.   Reviewed with patient modifications as well as joint protection and holding the leash when walking her dogs, picking up gripping pulling pushing objects. Using larger joints as well as built-up grip Reviewed some adaptive equipment [employer], [employer], electric can and jar opener To modify activities that she can Focusing on hands working" smaller and not harder" Keeping pain under 2/10. Using [facility] neoprene brace is not just for teaching music but also for functional tasks around the house for thumb CMC support  IF not able to pt to do massage gentle over small ball for webspace, rolling over roll pin  volar forearm  Prior to prayer stretch- review and change to decrease discomfort over thumb CMC Cont PA and RA of thumb   Try some in between students -4 5 times a day All pain-free Put bow and instrument down when talking or teaching  Tendon glides as well as opposition. Patient do show a possible subluxation of extensor tendon on the left fifth metacarpal.  Because of violin reaching into ulnar deviation repetitively. Will continue to monitor. Focus on active range of motion no strengthening.  Cont with joint protection and  modify and adapt activities.   PATIENT EDUCATION: Education details: findings of eval and HEP  Person educated: Patient Education method: Explanation, Demonstration, Tactile cues, Verbal cues, and Handouts Education comprehension: verbalized understanding, returned demonstration, verbal cues required, and needs further education   LONG TERM GOALS:  REVIEWED WITH PT  Target date: 6 wks   Patient to be independent in a HEP to increase thumb and wrist active range of motion in all planes to push door,  turn doorknob, do bathing and dressing symptoms free. Baseline: Thumb  radial abduction 45 to 55  with stiffness , decrease MC flexion in R in opposition to fifth , wrist ext decrease bilateral 55-64 degrees Goal status: INITIAL   2.  Bilateral digits flexion and extention increase to [facility] pain free for pt to hold cylinder objects and play instrument with less discomfort  Baseline: Stiffness with digits composite flexion and digits extention - stiffness worse in R 5th and L 2nd and 5th - clicking in L 5th MC - avoid end ranges because of stiffness and discomfort  Goal status: INITIAL   3.  Patient to verbalize and demo on straight 3 modifications or joint protection to increased ease on bilateral hands with ADLs and IADLs, 3 adaptive equipments to increase ease and discomfort using hands  baseline: Patient no knowledge about modifications or joint protection for  bilateral hands or upper extremity.  Goal status: INITIAL   4.  Grip and prehension strength increased to within normal range for her age without increased symptoms Baseline: Right grip 70 pounds and left 65 pounds, lateral pinch right 17 pounds  pain and left 19 pounds and 3-point pinch right 13 pound and left 16 pounds.  Patient had pain with gripping and palm and has thumb CMC less than a 2/10. Goal status: INITIAL  ASSESSMENT:  CLINICAL IMPRESSION: Patient seen  for occupational therapy evaluation for osteoarthritis in bilateral hands.  Patient with tenderness at bilateral thumb CMC as well as bilateral fifth digit PIP DIP and left second digit PIP DIP.  At eval  and last visit decreased bilateral wrist extension, thumb radial abduction as well as composite extension and flexion increased stiffness.  Patient with increased discomfort with grip and prehension strength in palm and thumb CMC.  Reviewed with patient today modifications and joint protection for activities using a larger joint on enlarging grips.  Grip and prehension strength with less pain within normal limits for her age.  Active range of motion in digits as well as thumb palmar radial abduction and wrist increased.  Patient to focus on moist heat and range of motion more than strength.  Patient to wear CMC neoprene's as needed.  Patient do so clicking at the left fifth metacarpal with MC flexion extension.  Appear to have a possible subluxation of extensor tendon because of reaching during violent activity.  Pt to cont with heat , soft tissue, stretches and ROM for wrist ext , thumb PA and RA -Pt to do stretches in between students or teaching-  Patient can benefit from skilled OT services to increase motion decrease pain and increase strength, ed on modifications and joint protection to maintain her functional use of bilateral hands and ADLs and IADLs.  PERFORMANCE DEFICITS: in functional skills including ADLs, IADLs, ROM, strength,  pain, flexibility, decreased knowledge of use of DME, and UE functional use,   and psychosocial skills including environmental adaptation and routines and behaviors.   IMPAIRMENTS: are limiting patient from ADLs, IADLs, rest and sleep, play, leisure, and social participation.   COMORBIDITIES: has no other co-morbidities that affects occupational performance. Patient will benefit from skilled OT to address above impairments and improve overall function.  MODIFICATION OR ASSISTANCE TO COMPLETE EVALUATION: No modification of tasks or assist necessary to complete an evaluation.  OT OCCUPATIONAL PROFILE AND HISTORY: Problem focused assessment: Including review of records relating to presenting problem.  CLINICAL DECISION MAKING: LOW - limited treatment options, no task modification necessary  REHAB POTENTIAL: Good for goals  EVALUATION COMPLEXITY: Low   PLAN:  OT FREQUENCY: 1-2 x/week depending on  progress  OT DURATION: 6 weeks PLANNING:  Splinting;Paraffin;Fluidtherapy;Contrast Bath;DME and/or AE instruction;Manual Therapy;Passive range of motion;Therapeutic activities, Ultrasound,  There ex, pt education    Oletta Cohn, OTR/L,CLT 02/16/2023, 4:33 PM

## 2023-02-27 ENCOUNTER — Ambulatory Visit: Payer: No Typology Code available for payment source | Admitting: Occupational Therapy

## 2023-02-27 DIAGNOSIS — M25642 Stiffness of left hand, not elsewhere classified: Secondary | ICD-10-CM

## 2023-02-27 DIAGNOSIS — M25641 Stiffness of right hand, not elsewhere classified: Secondary | ICD-10-CM

## 2023-02-27 DIAGNOSIS — M79641 Pain in right hand: Secondary | ICD-10-CM

## 2023-02-27 NOTE — Therapy (Signed)
OUTPATIENT OCCUPATIONAL THERAPY ORTHO TREATMENT  Patient Name: Debbie Farmer MRN: [id number] DOB:[birth year], 42 y.o., female Today's Date: 02/27/2023  PCP: Chestine Spore NP REFERRING PROVIDER: Defoor PA   END OF SESSION:  OT End of Session - 02/27/23 1109     Visit Number 4    Number of Visits 6    Date for OT Re-Evaluation 03/16/23    OT Start Time 0950    OT Stop Time 1040    OT Time Calculation (min) 50 min    Activity Tolerance Patient tolerated treatment well    Behavior During Therapy [facility] for tasks assessed/performed             Past Medical History:  Diagnosis Date   Acid reflux 2019   Acute non-recurrent maxillary sinusitis 05/08/2022   Allergic rhinitis    Allergy    Anxiety    Chlamydia 06/2016   Family history of breast cancer 06/08/2013   Lifetime risk 31.6%   History of mammogram 05/09/2014   birad 2;   History of Papanicolaou smear of cervix 04/27/2013   -/-;   Increased risk of breast cancer 2015   IBIS=31%   Right upper quadrant abdominal pain 04/16/2017   Urinary tract infection    Past Surgical History:  Procedure Laterality Date   WISDOM TOOTH EXTRACTION     WISDOM TOOTH EXTRACTION     Patient Active Problem List   Diagnosis Date Noted   Chronic joint pain 01/21/2022   Overweight (BMI 25.0-29.9) 05/22/2021   Seborrheic dermatitis 04/28/2019   Encounter for annual general medical examination with abnormal findings in adult 04/28/2019   Anxiety 04/16/2017   Increased risk of breast cancer 06/26/2016   Allergic rhinitis    Family history of breast cancer 06/08/2013    ONSET DATE: 01/22/23  REFERRING DIAG: Bilateral OA of hands   THERAPY DIAG:  Stiffness of left hand, not elsewhere classified  Stiffness of right hand, not elsewhere classified  Pain in both hands  Rationale for Evaluation and Treatment: Rehabilitation  SUBJECTIVE:   SUBJECTIVE STATEMENT: I done the wrist stretches and thumb.  With a week recital coming up in  2 weeks.  I had people help me set up and carry on hold push things thumbs feels tight.  But actually doing okay.  I do need to look at the leash with the dogs again.   Pt accompanied by: self  PERTINENT HISTORY: 01/22/23 Rheumatology visit:  Debbie Farmer is a 42 y.o.female who presents today for evaluation of joint pain.   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 She has had to cut back on recreational piano playing due to her pains in her hands.   Today She reports joint pains in her hands that onset 2019 in her DIPs of her small digits. 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 She notes a bit of discomfort at the base of her thumbs. She notes that  this is worsened by rest and improved by activity. She reports some morning stiffness but is unsure of duration, noting currently her morning stiffness does not change the timeframe of her early morning routine.   Assessment and Plan: Primary osteoarthritis of both hands (primary encounter diagnosis) Plan: X-ray hand right minimum 3 views, X-ray hand  left minimum 3 views, meloxicam (MOBIC) 15 MG  tablet, Ambulatory Referral to Occupational  Therapy  History and physical are persuasive for hand osteoarthritis with additional treatment considerations due to her professions. We will have her increase voltaren gel use to once nightly, work with Marisue Humble with  hand therapy to maximize hand function and minimize pain, and issue a prn meloxicam prescription for sparing use. We will plan to reevaluate her symptoms in a few months. Currently no suggestion of inflammatory arthritis.    PRECAUTIONS: None     WEIGHT BEARING RESTRICTIONS: No  PAIN:  Are you having pain? 2/10 at the most  FALLS: Has patient fallen in last 6 months? No  LIVING ENVIRONMENT: Lives with: lives with their spouse  PLOF:   PATIENT GOALS: I want to know what to do about the stiffness/pain- to maintain my motion and strength so that I can use my hands as long as I can in my occupations.  NEXT MD VISIT: 6 months  OBJECTIVE:  Note: Objective measures were completed at Evaluation unless otherwise noted.  HAND DOMINANCE: Right       UPPER EXTREMITY ROM:     Active ROM Right eval Left eval R/L 02/13/23 R/L 02/16/23  Shoulder flexion      Shoulder abduction      Shoulder adduction      Shoulder extension      Shoulder internal rotation      Shoulder external rotation      Elbow flexion      Elbow extension      Wrist flexion   85/82 90/82  Wrist extension 55 64 40/70 65/70  Wrist ulnar deviation 90 75 35   Wrist radial deviation   30   Wrist pronation      Wrist supination      (Blank rows = not tested)  Active ROM Right eval Left eval R/L 02/13/23 R/L 02/16/23 R/L 02/26/23  Thumb MCP (0-60)       Thumb IP (0-80)       Thumb Radial abd/add (0-55) 55 45  52/45 52/55 54/50   Thumb Palmar abd/add (0-45) 64  65 72/42 72/55 60/63   Thumb Opposition to Small Finger Opposition to 5th -decrease MC flexion   Opposition 5th  Opposition to 5th - flexion 70-85 at IP , Less MC flexion on R  Opposition to all digits - oval and keeping CMC out of CT  Index MCP (0-90) 75 80   85/85   Index PIP (0-100) 100 100   100   Index DIP (0-70)         Long MCP (0-90)  80 85   85/85   Long PIP (0-100)  100  100  100   Long DIP (0-70)         Ring MCP (0-90) 90  90   90    Ring PIP (0-100) 95   95  100   Ring DIP (0-70)         Little MCP (0-90) 90   90  90/90   Little PIP (0-100)  95  95  95/90   Little DIP (0-70)         (  Blank rows = not tested)   UPPER EX strength -  R wrist ext - radial deviating   HAND FUNCTION: Grip strength: Right: 70 lbs; Left: 65 lbs, Lateral pinch: Right: 17 lbs, Left: 19 lbs, and 3 point pinch: Right: 13 lbs, Left: 16 lbs Pain in palm with grip - lat pinch R pain thumb CMC   02/16/23 Grip strength: Right: 62 lbs; Left: 64 lbs, Lateral pinch: Right: 17 lbs, Left: 20 lbs, and 3 point pinch: Right: 14 lbs, Left: 15 lbs Less pain - pain with R thumb CMC lat pinch   COORDINATION: WFL  SENSATION: WFL  EDEMA: none notice  COGNITION: Overall cognitive status: Within functional limits for tasks assessed   OBSERVATIONS:    TODAY'S TREATMENT:                                                                                                                              DATE: 02/26/23 Patient done wrist extension stretches in the shower as well as thumb palmar radial abduction since seen last time - Able to maintain ROM   Cont to be tight in forearm flexors and webspace - tight and tender in webspace  Patient do show a possible subluxation of extensor tendon on the left fifth metacarpal.  Because of violin reaching into ulnar deviation repetitively. Will continue to monitor. L 5th digit MC still click or lock at composite fist -fabricate MC block splint to wear night time and composite fist activities during day - appear extensor tendon slipping off MC Patient within normal limits for grip and prehension for her age.  Did use her hands around the house as well as walking her 2 great Dane dogs on a leash.   Reviewed with patient modifications as well as joint protection and holding the leash when walking her dogs, picking up gripping pulling pushing objects. Leash slips off wrist over hand - to make little smaller or around  waist  Using larger joints as well as built-up grip Reviewed some adaptive equipment [employer], [employer], electric can and jar opener To modify activities that she can Focusing on hands working" smarter and not harder" Keeping pain under 2/10. 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 Focus on active range of motion no strengthening.  Cont with joint protection and  modify and adapt activities.   PATIENT EDUCATION:  Education details: findings of eval and HEP  Person educated: Patient Education method: Explanation, Demonstration, Tactile cues, Verbal cues, and Handouts Education comprehension: verbalized understanding, returned demonstration, verbal cues required, and needs further education   LONG TERM GOALS:  REVIEWED WITH PT  Target date: 6 wks   Patient to be independent in a HEP to increase thumb and wrist active range of motion in all planes to push door,  turn doorknob, do bathing and dressing symptoms free. Baseline: Thumb  radial abduction 45 to 55 with stiffness , decrease MC flexion in R in opposition to fifth , wrist ext decrease bilateral 55-64 degrees Goal status: INITIAL   2.  Bilateral digits flexion and extention increase to [facility] pain free for pt to hold cylinder objects and play instrument with less discomfort  Baseline: Stiffness with digits composite flexion and digits extention - stiffness worse in R 5th and L 2nd and 5th - clicking in L  5th MC - avoid end ranges because of stiffness and discomfort  Goal status: INITIAL   3.  Patient to verbalize and demo on straight 3 modifications or joint protection to increased ease on bilateral hands with ADLs and IADLs, 3 adaptive equipments to increase ease and discomfort using hands  baseline: Patient no knowledge about modifications or joint protection for bilateral hands or upper extremity.  Goal status: INITIAL   4.  Grip and prehension strength increased to within normal range for her age without increased symptoms Baseline: Right grip 70 pounds and left 65 pounds, lateral pinch right 17 pounds  pain and left 19 pounds and 3-point pinch right 13 pound and left 16 pounds.  Patient had pain with gripping and palm and has thumb CMC less than a 2/10. Goal status: INITIAL  ASSESSMENT:  CLINICAL IMPRESSION: Patient seen  for occupational therapy evaluation for osteoarthritis in bilateral hands.  Patient with tenderness at bilateral thumb CMC as well as bilateral fifth digit PIP DIP and left second digit PIP DIP.  At eval  and last visit decreased bilateral wrist extension, thumb radial abduction as well as composite extension and flexion increased stiffness.  Patient with increased discomfort with grip and prehension strength in palm and thumb CMC.  Reviewed with patient to cont with modifications and joint protection for activities using a larger joint on enlarging grips.  Grip and prehension strength with less pain within normal limits for her age.  Active range of motion in digits as well as thumb palmar radial abduction and wrist able to maintain her motion.  Patient to focus on moist heat and range of motion more than strength.  Patient to wear CMC neoprene's as needed.  Patient do appear to have a possible subluxation of extensor tendon at 5th L MC with composite flexion- fabricate MC block splint night time and during functional tasks during day - cause probably because of reaching during  violent activity.  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  Patient can benefit from skilled OT services to increase motion decrease pain and increase strength, ed on modifications and joint protection to maintain her functional use of bilateral hands and ADLs and IADLs.  PERFORMANCE DEFICITS: in functional skills including ADLs, IADLs, ROM, strength, pain, flexibility, decreased knowledge of use of DME, and UE functional use,   and psychosocial skills including environmental  adaptation and routines and behaviors.   IMPAIRMENTS: are limiting patient from ADLs, IADLs, rest and sleep, play, leisure, and social participation.   COMORBIDITIES: has no other co-morbidities that affects occupational performance. Patient will benefit from skilled OT to address above impairments and improve overall function.  MODIFICATION OR ASSISTANCE TO COMPLETE EVALUATION: No modification of tasks or assist necessary to complete an evaluation.  OT OCCUPATIONAL PROFILE AND HISTORY: Problem focused assessment: Including review of records relating to presenting problem.  CLINICAL DECISION MAKING: LOW - limited treatment options, no task modification necessary  REHAB POTENTIAL: Good for goals  EVALUATION COMPLEXITY: Low   PLAN:  OT FREQUENCY: 1-2 x/week depending on progress  OT DURATION: 3  weeks PLANNING:  Splinting;Paraffin;Fluidtherapy;Contrast Bath;DME and/or AE instruction;Manual Therapy;Passive range of motion;Therapeutic activities, Ultrasound,  There ex, pt education    Oletta Cohn, OTR/L,CLT 02/27/2023, 11:11 AM

## 2023-03-16 ENCOUNTER — Ambulatory Visit: Payer: Self-pay | Attending: Rheumatology | Admitting: Occupational Therapy

## 2023-03-16 DIAGNOSIS — M25641 Stiffness of right hand, not elsewhere classified: Secondary | ICD-10-CM | POA: Insufficient documentation

## 2023-03-16 DIAGNOSIS — M25642 Stiffness of left hand, not elsewhere classified: Secondary | ICD-10-CM | POA: Insufficient documentation

## 2023-03-16 DIAGNOSIS — M79642 Pain in left hand: Secondary | ICD-10-CM | POA: Insufficient documentation

## 2023-03-16 DIAGNOSIS — M79641 Pain in right hand: Secondary | ICD-10-CM | POA: Insufficient documentation

## 2023-03-16 NOTE — Therapy (Signed)
OUTPATIENT OCCUPATIONAL THERAPY ORTHO TREATMENT  Patient Name: Debbie Farmer MRN: [id number] DOB:[birth year], 42 y.o., female Today's Date: 03/16/2023  PCP: Chestine Spore NP REFERRING PROVIDER: Defoor PA   END OF SESSION:  OT End of Session - 03/16/23 1342     Visit Number 5    Number of Visits 6    Date for OT Re-Evaluation 03/16/23    OT Start Time 0950    OT Stop Time 1033    OT Time Calculation (min) 43 min    Activity Tolerance Patient tolerated treatment well    Behavior During Therapy [facility] for tasks assessed/performed             Past Medical History:  Diagnosis Date   Acid reflux 2019   Acute non-recurrent maxillary sinusitis 05/08/2022   Allergic rhinitis    Allergy    Anxiety    Chlamydia 06/2016   Family history of breast cancer 06/08/2013   Lifetime risk 31.6%   History of mammogram 05/09/2014   birad 2;   History of Papanicolaou smear of cervix 04/27/2013   -/-;   Increased risk of breast cancer 2015   IBIS=31%   Right upper quadrant abdominal pain 04/16/2017   Urinary tract infection    Past Surgical History:  Procedure Laterality Date   WISDOM TOOTH EXTRACTION     WISDOM TOOTH EXTRACTION     Patient Active Problem List   Diagnosis Date Noted   Chronic joint pain 01/21/2022   Overweight (BMI 25.0-29.9) 05/22/2021   Seborrheic dermatitis 04/28/2019   Encounter for annual general medical examination with abnormal findings in adult 04/28/2019   Anxiety 04/16/2017   Increased risk of breast cancer 06/26/2016   Allergic rhinitis    Family history of breast cancer 06/08/2013    ONSET DATE: 01/22/23  REFERRING DIAG: Bilateral OA of hands   THERAPY DIAG:  Stiffness of left hand, not elsewhere classified  Stiffness of right hand, not elsewhere classified  Pain in both hands  Rationale for Evaluation and Treatment: Rehabilitation  SUBJECTIVE:   SUBJECTIVE STATEMENT: We had our Christmas recidal this past weekend - and my R thumb was  hurting Sat night- but I did ask more for help with setup - using the soft black splints and that is helping - love the small pinkie splint -but poking me at this one spot - but using it a lot- doing okay - better   Pt accompanied by: self  PERTINENT HISTORY: 01/22/23 Rheumatology visit:  Debbie Farmer is a 42 y.o.female who presents today for evaluation of joint pain.   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 She has had to cut back on recreational piano playing due to her pains in her hands.   Today She reports joint pains in her hands that onset 2019 in her DIPs of her small digits. 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 She notes a bit of discomfort at the  base of her thumbs. She notes that this is worsened by rest and improved by activity. She reports some morning stiffness but is unsure of duration, noting currently her morning stiffness does not change the timeframe of her early morning routine.   Assessment and Plan: Primary osteoarthritis of both hands (primary encounter diagnosis) Plan: X-ray hand right minimum 3 views, X-ray hand  left minimum 3 views, meloxicam (MOBIC) 15 MG  tablet, Ambulatory Referral to Occupational  Therapy  History and physical are persuasive for hand osteoarthritis with additional treatment considerations due to her professions. We will have her increase voltaren gel  use to once nightly, work with Marisue Humble with hand therapy to maximize hand function and minimize pain, and issue a prn meloxicam prescription for sparing use. We will plan to reevaluate her symptoms in a few months. Currently no suggestion of inflammatory arthritis.    PRECAUTIONS: None     WEIGHT BEARING RESTRICTIONS: No  PAIN:  Are you having pain? 2/10 at the most  FALLS: Has patient fallen in last 6 months? No  LIVING ENVIRONMENT: Lives with: lives with their spouse  PLOF:   PATIENT GOALS: I want to know what to do about the stiffness/pain- to maintain my motion and strength so that I can use my hands as long as I can in my occupations.  NEXT MD VISIT: 6 months  OBJECTIVE:  Note: Objective measures were completed at Evaluation unless otherwise noted.  HAND DOMINANCE: Right       UPPER EXTREMITY ROM:     Active ROM Right eval Left eval R/L 02/13/23 R/L 02/16/23 R/L 03/16/23  Shoulder flexion       Shoulder abduction       Shoulder adduction       Shoulder extension       Shoulder internal rotation       Shoulder external rotation       Elbow flexion       Elbow extension       Wrist flexion   85/82 90/82 90/90   Wrist extension 55 64 40/70 65/70 60/75   Wrist ulnar deviation 90 75 35    Wrist radial deviation   30    Wrist pronation       Wrist supination       (Blank rows = not tested)  Active ROM Right eval Left eval R/L 02/13/23 R/L 02/16/23 R/L 02/26/23 R/L 03/16/23  Thumb MCP (0-60)        Thumb IP (0-80)        Thumb Radial abd/add (0-55) 55 45  52/45 52/55 54/50  45/55  Thumb Palmar abd/add (0-45) 64  65 72/42 72/55 60/63  48/55  Thumb Opposition to Small Finger Opposition to 5th -decrease MC flexion   Opposition 5th  Opposition to 5th - flexion 70-85 at IP , Less MC flexion on R  Opposition to all digits - oval and keeping CMC out of CT   Index MCP (0-90) 75 80   85/85    Index PIP (0-100) 100 100   100    Index DIP (0-70)          Long MCP  (0-90)  80 85   85/85    Long PIP (0-100)  100  100  100    Long DIP (0-70)          Ring MCP (0-90) 90  90   90    Ring PIP (0-100) 95   95  100    Ring DIP (  0-70)          Little MCP (0-90) 90   90  90/90    Little PIP (0-100)  95  95  95/90    Little DIP (0-70)          (Blank rows = not tested)   UPPER EX strength -  R wrist ext - radial deviating   HAND FUNCTION: Grip strength: Right: 70 lbs; Left: 65 lbs, Lateral pinch: Right: 17 lbs, Left: 19 lbs, and 3 point pinch: Right: 13 lbs, Left: 16 lbs Pain in palm with grip - lat pinch R pain thumb CMC   02/16/23 Grip strength: Right: 62 lbs; Left: 64 lbs, Lateral pinch: Right: 17 lbs, Left: 20 lbs, and 3 point pinch: Right: 14 lbs, Left: 15 lbs Less pain - pain with R thumb CMC lat pinch  03/16/23 less grip and lat pinch -with some discomfort -   COORDINATION: WFL  SENSATION: WFL  EDEMA: none notice  COGNITION: Overall cognitive status: Within functional limits for tasks assessed   OBSERVATIONS:    TODAY'S TREATMENT:                                                                                                                              DATE: 03/16/23 Patient done wrist extension stretches in the shower as well as thumb palmar radial abduction since seen last time - Did had big recidal with her clients -and was hurting some the next day at the R thumb - and webspace tight and tender today    Cont to be tight in forearm flexors and webspace - tight and tender in webspace R worse than L Patient do show a possible subluxation of extensor tendon on the left fifth metacarpal.  Because of violin reaching into ulnar deviation repetitively.  L 5th digit MC still click or lock at composite fist - last session fabricated MC block splint to wear night time and composite fist activities during day - appear extensor tendon slipping off MC Done great with using it - needed some padding for use during day - and will see if can  tolerate night time     In the past did education Using larger joints as well as built-up grip Reviewed some adaptive equipment [employer], [employer], electric can and jar opener To modify activities that she can Focusing on hands working" smarter and not harder" Keeping pain under 2/10. 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 but also for functional tasks around the house for thumb [facility] support Info provided for hard CMC splint - to order from [employer]- size and can try will reassess next session    IF husband not able to  do webspace massage  review for pt to do massage gentle over small ball for webspace, rolling over rolling or ball volar forearm  Prior to prayer stretch- review and change to decrease discomfort over thumb CMC Cont PA and RA  of thumb - can do gentle PROM prior to AROM As well as pect stretch followed by RTB for shoulder flexion, ABD - pect dynamic stretch  3 x day inbetween students  Try some in between students -4 5 times a day All pain-free Put bow and instrument down when talking or teaching  Tendon glides as well as opposition. Focus on active range of motion no strengthening.  Cont with joint protection and  modify and adapt activities.   PATIENT EDUCATION: Education details: findings of eval and HEP  Person educated: Patient Education method: Explanation, Demonstration, Tactile cues, Verbal cues, and Handouts Education comprehension: verbalized understanding, returned demonstration, verbal cues required, and needs further education   LONG TERM GOALS:  REVIEWED WITH PT  Target date: 6 wks   Patient to be independent in a HEP to increase thumb and wrist active range of motion in all planes to push door,  turn doorknob, do bathing and dressing symptoms free. Baseline: Thumb  radial abduction 45 to 55 with stiffness , decrease MC flexion in R in opposition to fifth , wrist ext decrease bilateral 55-64 degrees Goal status: INITIAL   2.   Bilateral digits flexion and extention increase to [facility] pain free for pt to hold cylinder objects and play instrument with less discomfort  Baseline: Stiffness with digits composite flexion and digits extention - stiffness worse in R 5th and L 2nd and 5th - clicking in L 5th MC - avoid end ranges because of stiffness and discomfort  Goal status: INITIAL   3.  Patient to verbalize and demo on straight 3 modifications or joint protection to increased ease on bilateral hands with ADLs and IADLs, 3 adaptive equipments to increase ease and discomfort using hands  baseline: Patient no knowledge about modifications or joint protection for bilateral hands or upper extremity.  Goal status: INITIAL   4.  Grip and prehension strength increased to within normal range for her age without increased symptoms Baseline: Right grip 70 pounds and left 65 pounds, lateral pinch right 17 pounds  pain and left 19 pounds and 3-point pinch right 13 pound and left 16 pounds.  Patient had pain with gripping and palm and has thumb CMC less than a 2/10. Goal status: INITIAL  ASSESSMENT:  CLINICAL IMPRESSION: Patient seen  for occupational therapy evaluation for osteoarthritis in bilateral hands.  Patient with tenderness at bilateral thumb CMC as well as bilateral fifth digit PIP DIP and left second digit PIP DIP.  At eval  and last visit decreased bilateral wrist extension, thumb radial abduction as well as composite extension and flexion increased stiffness.  Patient with increased discomfort with grip and prehension strength in palm and thumb CMC.  Reviewed with patient to cont with modifications and joint protection for activities using a larger joint on enlarging grips.  Grip and prehension strength with less pain within normal limits for her age.  Active range of motion in digits as well as thumb palmar radial abduction and wrist able to maintain her motion.  Patient to focus on moist heat and range of motion more than  strength.  Patient to wear [facility] neoprene's as needed and look into hard CMC on [employer] for R.  Patient do appear to have a possible subluxation of extensor tendon at 5th L MC with composite flexion- fabricated last session  MC block splint night time and during functional tasks during day - doing great using it - cause probably because of reaching during violent activity.  Pt  to cont with heat , soft tissue, stretches and ROM for wrist ext , thumb PA and RA -Pt to do stretches in between students or teaching-  Add pect stretch and dynamic shoulder flexion, ABD and pect stretch with RTB - pt to follow up in month- feel like doing well with modifications and HEP.  Patient can benefit from skilled OT services to increase motion decrease pain and increase strength, ed on modifications and joint protection to maintain her functional use of bilateral hands and ADLs and IADLs.  PERFORMANCE DEFICITS: in functional skills including ADLs, IADLs, ROM, strength, pain, flexibility, decreased knowledge of use of DME, and UE functional use,   and psychosocial skills including environmental adaptation and routines and behaviors.   IMPAIRMENTS: are limiting patient from ADLs, IADLs, rest and sleep, play, leisure, and social participation.   COMORBIDITIES: has no other co-morbidities that affects occupational performance. Patient will benefit from skilled OT to address above impairments and improve overall function.  MODIFICATION OR ASSISTANCE TO COMPLETE EVALUATION: No modification of tasks or assist necessary to complete an evaluation.  OT OCCUPATIONAL PROFILE AND HISTORY: Problem focused assessment: Including review of records relating to presenting problem.  CLINICAL DECISION MAKING: LOW - limited treatment options, no task modification necessary  REHAB POTENTIAL: Good for goals  EVALUATION COMPLEXITY: Low   PLAN:  OT FREQUENCY: 1 x in month   OT DURATION: 4 weeks PLANNING:   Splinting;Paraffin;Fluidtherapy;Contrast Bath;DME and/or AE instruction;Manual Therapy;Passive range of motion;Therapeutic activities, Ultrasound,  There ex, pt education    Oletta Cohn, OTR/L,CLT 03/16/2023, 1:43 PM

## 2023-04-24 ENCOUNTER — Ambulatory Visit: Payer: Self-pay | Attending: Rheumatology | Admitting: Occupational Therapy

## 2023-04-24 DIAGNOSIS — M79642 Pain in left hand: Secondary | ICD-10-CM | POA: Insufficient documentation

## 2023-04-24 DIAGNOSIS — M25642 Stiffness of left hand, not elsewhere classified: Secondary | ICD-10-CM | POA: Insufficient documentation

## 2023-04-24 DIAGNOSIS — M25641 Stiffness of right hand, not elsewhere classified: Secondary | ICD-10-CM | POA: Insufficient documentation

## 2023-04-24 DIAGNOSIS — M79641 Pain in right hand: Secondary | ICD-10-CM | POA: Insufficient documentation

## 2023-04-24 NOTE — Therapy (Signed)
OUTPATIENT OCCUPATIONAL THERAPY ORTHO TREATMENT  Patient Name: Debbie Farmer MRN: [id number] DOB:1980-11-25, 43 y.o., female Today's Date: 04/24/2023  PCP: Chestine Spore NP REFERRING PROVIDER: Defoor PA   END OF SESSION:  OT End of Session - 04/24/23 1435     Visit Number 6    Number of Visits 8    Date for OT Re-Evaluation 12/25/23    OT Start Time 0910    OT Stop Time 0950    OT Time Calculation (min) 40 min    Activity Tolerance Patient tolerated treatment well    Behavior During Therapy [facility] for tasks assessed/performed             Past Medical History:  Diagnosis Date   Acid reflux 2019   Acute non-recurrent maxillary sinusitis 05/08/2022   Allergic rhinitis    Allergy    Anxiety    Chlamydia 06/2016   Family history of breast cancer 06/08/2013   Lifetime risk 31.6%   History of mammogram 05/09/2014   birad 2;   History of Papanicolaou smear of cervix 04/27/2013   -/-;   Increased risk of breast cancer 2015   IBIS=31%   Right upper quadrant abdominal pain 04/16/2017   Urinary tract infection    Past Surgical History:  Procedure Laterality Date   WISDOM TOOTH EXTRACTION     WISDOM TOOTH EXTRACTION     Patient Active Problem List   Diagnosis Date Noted   Chronic joint pain 01/21/2022   Overweight (BMI 25.0-29.9) 05/22/2021   Seborrheic dermatitis 04/28/2019   Encounter for annual general medical examination with abnormal findings in adult 04/28/2019   Anxiety 04/16/2017   Increased risk of breast cancer 06/26/2016   Allergic rhinitis    Family history of breast cancer 06/08/2013    ONSET DATE: 01/22/23  REFERRING DIAG: Bilateral OA of hands   THERAPY DIAG:  Stiffness of left hand, not elsewhere classified  Stiffness of right hand, not elsewhere classified  Pain in both hands  Rationale for Evaluation and Treatment: Rehabilitation  SUBJECTIVE:   SUBJECTIVE STATEMENT: I done great since I seen you.  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  My fingers feels much better but my thumbs last week feels tight and sore at times.  I left the splint on my left pinky.  Is really helping me while playing the cello as well as decreasing the clicking and locking of the finger.  If you can make me 1 more please.  I am using this thumb soft splints mostly for outside activities and DIY activities.  I am following up with rheumatology coming up.  Can I follow-up with you after the winter before the spring. Pt accompanied by: self  PERTINENT HISTORY: 01/22/23 Rheumatology visit:  ARIEN BLACKSTOCK is a 43 y.o.female who presents today for evaluation of joint pain.   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 She has had to cut back on recreational piano playing due to her pains in her hands.   Today She reports joint pains in her hands that onset 2019 in her DIPs of her small digits. 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 She notes a bit of discomfort at the base of her thumbs. She notes that this is worsened by rest and improved by activity. She reports some morning stiffness but is unsure of duration, noting currently her morning stiffness does not change the timeframe of her early morning routine.   Assessment and Plan: Primary osteoarthritis of both hands (primary encounter diagnosis) Plan: X-ray hand right minimum 3 views, X-ray hand   left minimum 3 views, meloxicam (MOBIC) 15 MG  tablet, Ambulatory Referral to Occupational  Therapy  History and physical are persuasive for hand osteoarthritis with additional treatment considerations due to her professions. We will have her increase voltaren gel use to once nightly, work with Marisue Humble with hand therapy to maximize hand function and minimize pain, and issue a prn meloxicam prescription for sparing use. We will plan to reevaluate her symptoms in a few months. Currently no suggestion of inflammatory arthritis.    PRECAUTIONS: None     WEIGHT BEARING RESTRICTIONS: No  PAIN:  Are you having pain? 2/10 at the most  FALLS: Has patient fallen in last 6 months? No  LIVING ENVIRONMENT: Lives with: lives with their spouse  PLOF:   PATIENT GOALS: I want to know what to do about the stiffness/pain- to maintain my motion and strength so that I can use my hands as long as I can in my occupations.  NEXT MD VISIT: 6 months  OBJECTIVE:  Note: Objective measures were completed at Evaluation unless otherwise noted.  HAND DOMINANCE: Right       UPPER EXTREMITY ROM:     Active ROM Right eval Left eval R/L 02/13/23 R/L 02/16/23 R/L 03/16/23 And 04/24/23  Shoulder flexion       Shoulder abduction       Shoulder adduction       Shoulder extension       Shoulder internal rotation       Shoulder external rotation       Elbow flexion       Elbow extension       Wrist flexion   85/82 90/82 90/90   Wrist extension 55 64 40/70 65/70 60/75   Wrist ulnar deviation 90 75 35  35  Wrist radial deviation   30  30  Wrist pronation       Wrist supination       (Blank rows = not tested)  Active ROM Right eval Left eval R/L 02/13/23 R/L 02/16/23 R/L 02/26/23 R/L 03/16/23 R/L 04/24/23  Thumb MCP (0-60)         Thumb IP (0-80)         Thumb Radial abd/add (0-55) 55 45  52/45 52/55 54/50  45/55 48/50  Thumb Palmar abd/add (0-45) 64  65 72/42 72/55 60/63  48/55 55/65  Thumb  Opposition to Small Finger Opposition to 5th -decrease MC flexion   Opposition 5th  Opposition to 5th - flexion 70-85 at IP , Less MC flexion on R  Opposition to all digits - oval and keeping CMC out of CT    Index MCP (0-90) 75 80   85/85     Index PIP (0-100) 100 100   100     Index DIP (0-70)  Long MCP (0-90)  80 85   85/85     Long PIP (0-100)  100  100  100     Long DIP (0-70)           Ring MCP (0-90) 90  90   90     Ring PIP (0-100) 95   95  100     Ring DIP (0-70)           Little MCP (0-90) 90   90  90/90     Little PIP (0-100)  95  95  95/90     Little DIP (0-70)           (Blank rows = not tested)   UPPER EX strength -  R wrist ext - radial deviating   HAND FUNCTION: Grip strength: Right: 70 lbs; Left: 65 lbs, Lateral pinch: Right: 17 lbs, Left: 19 lbs, and 3 point pinch: Right: 13 lbs, Left: 16 lbs Pain in palm with grip - lat pinch R pain thumb CMC   02/16/23 Grip strength: Right: 62 lbs; Left: 64 lbs, Lateral pinch: Right: 17 lbs, Left: 20 lbs, and 3 point pinch: Right: 14 lbs, Left: 15 lbs Less pain - pain with R thumb CMC lat pinch  03/16/23 less grip and lat pinch -with some discomfort -  04/24/23  Grip strength: Right: 60 lbs; Left: 62 lbs, Lateral pinch: Right: 16 lbs, Left: 19 lbs, and 3 point pinch: Right: 17 lbs, Left: 16 lbs  COORDINATION: WFL  SENSATION: WFL  EDEMA: none notice  COGNITION: Overall cognitive status: Within functional limits for tasks assessed   OBSERVATIONS:    TODAY'S TREATMENT:                                                                                                                              DATE: 04/24/23 Patient done wrist extension stretches in the shower as well as thumb palmar radial abduction since seen 5 weeks ago 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 Was working in the yard and doing some DIY activities around the house-using her [facility] neoprene Walking her 2 great  Dane dogs no issues Is looking into a different leash.  Patient report digits doing very well but last week since starting back teaching thumbs is bothering her little bit.  With some soreness. But is getting and intern that  can help her with some of her activities at work.   Cont to be tight in bil webspace -tightness with some tenderness limiting radial abduction of thumb more than palmar abduction  Did add to patient active assisted range of motion for ulnar deviation to maintain motion and prevent tendinitis.   As well as active assisted range of motion for palmar radial abduction after some moist heat   Patient has been using her MC block splint on the left fifth metacarpal and report great success using it to decrease clicking  and popping of left fifth digit.   Patient was able to do composite flexion several times in clinic with no popping or clicking.   Fabricated patient another 1 to use over the next few months.   Patient do show a possible subluxation of extensor tendon on the left fifth metacarpal.  Because of violin reaching into ulnar deviation repetitively.  Patient able to maintain progress in digit flexion and extension as well as a wrist active range of motion  Grip and prehension was able to maintain over the last 5 6 weeks.    Patient continued to do modifications and adaptations to activities as well as joint protection.    In the past did education Using larger joints as well as built-up grip Reviewed some adaptive equipment [employer], [employer], electric can and jar opener To modify activities that she can Focusing on hands working" smarter and not harder" Keeping pain under 2/10. Using [facility] neoprene brace as needed  for functional tasks around the house for thumb [facility] support    Patient to continue to do moist heat with wrist extension stretches, palmar radial abduction of the thumb, tendon glides and opposition. Patient will continue with home program for about 2  months and wants to check back at the end of the winter.  PATIENT EDUCATION: Education details: findings of eval and HEP  Person educated: Patient Education method: Explanation, Demonstration, Tactile cues, Verbal cues, and Handouts Education comprehension: verbalized understanding, returned demonstration, verbal cues required, and needs further education   LONG TERM GOALS:  REVIEWED WITH PT  Target date: 6 wks   Patient to be independent in a HEP to increase thumb and wrist active range of motion in all planes to push door,  turn doorknob, do bathing and dressing symptoms free. Baseline: Thumb  radial abduction 45 to 55 with stiffness , decrease MC flexion in R in opposition to fifth , wrist ext decrease bilateral 55-64 degrees - NOW Progress in all to [facility] Goal status:MET   2.  Bilateral digits flexion and extention increase to [facility] pain free for pt to hold cylinder objects and play instrument with less discomfort  Baseline: Stiffness with digits composite flexion and digits extention - stiffness worse in R 5th and L 2nd and 5th - clicking in L 5th MC - avoid end ranges because of stiffness and discomfort  NOW less clicking of left/digit as well as active range of motion for digit flexion extension within normal Goal status: MET   3.  Patient to verbalize and demo on straight 3 modifications or joint protection to increased ease on bilateral hands with ADLs and IADLs, 3 adaptive equipments to increase ease and discomfort using hands  baseline: Patient no knowledge about modifications or joint protection for bilateral hands or upper extremity.  NOW patient made several modifications as well as joint protection as well as splint use to increased ease of using hands in ADLs and IADLs  Goal status:MET   4.  Grip and prehension strength increased to within normal range for her age without increased symptoms Baseline: Right grip 70 pounds and left 65 pounds, lateral pinch right 17 pounds  pain and  left 19 pounds and 3-point pinch right 13 pound and left 16 pounds.  Patient had pain with gripping and palm and has thumb CMC less than a 2/10. NOW patient's grip and prehension strength is within normal limits for age still but decreased with a few pounds and grip, prehension maintained but patient has less  pain. Goal status: progressing  ASSESSMENT:  CLINICAL IMPRESSION: Patient seen  for occupational therapy evaluation for osteoarthritis in bilateral hands.  Patient with tenderness at bilateral thumb CMC as well as bilateral fifth digit PIP DIP and left second digit PIP DIP.  At eval decreased bilateral wrist extension, thumb radial abduction as well as composite extension and flexion increased stiffness.  Patient with increased discomfort with grip and prehension strength in palm and thumb CMC.  Reviewed with patient to cont with modifications and joint protection for activities using a larger joint on enlarging grips.  Grip and prehension strength with less pain within normal limits for her age.  Active range of motion in digits as well as thumb palmar radial abduction and wrist is within normal limits and patient able to maintain her motion the last 5 to 6 weeks.   Patient to focus on moist heat and range of motion more than strength.  Patient do wear CMC neoprene's as needed.. Patient do appear to have a possible subluxation of extensor tendon at 5th L MC with composite flexion- f patient has been wearing MC block splint night time and during functional tasks during day - doing great using it -causing less popping and clicking with composite flexion and use.    Pt to cont with heat , soft tissue, stretches and ROM for wrist ext ,  UD , thumb PA and RA -patient to continue with home program as well as modifications and splint use for the next 2 months and will follow-up with me at the end of the winter to reassess prior to spring and increase use of bilateral hands.  Patient can benefit from skilled OT  services to increase motion decrease pain and increase strength, ed on modifications and joint protection to maintain her functional use of bilateral hands and ADLs and IADLs.  PERFORMANCE DEFICITS: in functional skills including ADLs, IADLs, ROM, strength, pain, flexibility, decreased knowledge of use of DME, and UE functional use,   and psychosocial skills including environmental adaptation and routines and behaviors.   IMPAIRMENTS: are limiting patient from ADLs, IADLs, rest and sleep, play, leisure, and social participation.   COMORBIDITIES: has no other co-morbidities that affects occupational performance. Patient will benefit from skilled OT to address above impairments and improve overall function.  MODIFICATION OR ASSISTANCE TO COMPLETE EVALUATION: No modification of tasks or assist necessary to complete an evaluation.  OT OCCUPATIONAL PROFILE AND HISTORY: Problem focused assessment: Including review of records relating to presenting problem.  CLINICAL DECISION MAKING: LOW - limited treatment options, no task modification necessary  REHAB POTENTIAL: Good for goals  EVALUATION COMPLEXITY: Low   PLAN:  OT FREQUENCY: 2 visits   OT DURATION: 8 months PLANNING:  Splinting;Paraffin;Fluidtherapy;Contrast Bath;DME and/or AE instruction;Manual Therapy;Passive range of motion;Therapeutic activities, Ultrasound,  There ex, pt education    Oletta Cohn, OTR/L,CLT 04/24/2023, 2:37 PM

## 2023-11-30 ENCOUNTER — Telehealth: Payer: Self-pay | Admitting: *Deleted

## 2024-01-07 ENCOUNTER — Telehealth: Payer: Self-pay

## 2024-01-08 ENCOUNTER — Other Ambulatory Visit: Payer: Self-pay | Admitting: Primary Care

## 2024-01-08 DIAGNOSIS — Z1231 Encounter for screening mammogram for malignant neoplasm of breast: Secondary | ICD-10-CM

## 2024-02-04 ENCOUNTER — Ambulatory Visit
Admission: RE | Admit: 2024-02-04 | Discharge: 2024-02-04 | Disposition: A | Discharge status: Home / Self Care | Payer: Self-pay | Source: Ambulatory Visit | Attending: Primary Care | Admitting: Primary Care

## 2024-02-04 DIAGNOSIS — Z1231 Encounter for screening mammogram for malignant neoplasm of breast: Secondary | ICD-10-CM | POA: Insufficient documentation

## 2024-02-09 ENCOUNTER — Ambulatory Visit: Payer: Self-pay | Admitting: Primary Care
